# Patient Record
Sex: Male | Born: 1960 | Race: White | Hispanic: No | Marital: Single | Smoking: Current every day smoker
Health system: Southern US, Community
[De-identification: ages and names within clinical notes are randomized; demographics above are authoritative.]

## PROBLEM LIST (undated history)

## (undated) DIAGNOSIS — F17200 Nicotine dependence, unspecified, uncomplicated: Secondary | ICD-10-CM

## (undated) DIAGNOSIS — Z21 Asymptomatic human immunodeficiency virus [HIV] infection status: Secondary | ICD-10-CM

## (undated) DIAGNOSIS — B20 Human immunodeficiency virus [HIV] disease: Secondary | ICD-10-CM

## (undated) DIAGNOSIS — K219 Gastro-esophageal reflux disease without esophagitis: Secondary | ICD-10-CM

## (undated) DIAGNOSIS — G5622 Lesion of ulnar nerve, left upper limb: Secondary | ICD-10-CM

## (undated) HISTORY — DX: Lesion of ulnar nerve, left upper limb: G56.22

## (undated) HISTORY — DX: Nicotine dependence, unspecified, uncomplicated: F17.200

## (undated) HISTORY — DX: Gastro-esophageal reflux disease without esophagitis: K21.9

## (undated) HISTORY — DX: Asymptomatic human immunodeficiency virus (hiv) infection status: Z21

## (undated) HISTORY — DX: Human immunodeficiency virus (HIV) disease: B20

---

## 2001-12-07 ENCOUNTER — Ambulatory Visit (HOSPITAL_COMMUNITY): Admission: RE | Admit: 2001-12-07 | Discharge: 2001-12-07 | Payer: Self-pay | Admitting: Preventative Medicine

## 2001-12-07 ENCOUNTER — Encounter: Payer: Self-pay | Admitting: Preventative Medicine

## 2006-05-16 HISTORY — PX: COLONOSCOPY: SHX174

## 2006-08-25 ENCOUNTER — Ambulatory Visit: Payer: Self-pay | Admitting: Family Medicine

## 2006-08-31 ENCOUNTER — Ambulatory Visit (HOSPITAL_COMMUNITY): Admission: RE | Admit: 2006-08-31 | Discharge: 2006-08-31 | Payer: Self-pay | Admitting: Family Medicine

## 2006-09-12 ENCOUNTER — Ambulatory Visit: Payer: Self-pay | Admitting: Family Medicine

## 2006-10-23 ENCOUNTER — Ambulatory Visit: Payer: Self-pay | Admitting: Internal Medicine

## 2006-11-10 ENCOUNTER — Encounter: Payer: Self-pay | Admitting: Internal Medicine

## 2006-11-10 ENCOUNTER — Ambulatory Visit: Payer: Self-pay | Admitting: Internal Medicine

## 2006-11-10 ENCOUNTER — Ambulatory Visit (HOSPITAL_COMMUNITY): Admission: RE | Admit: 2006-11-10 | Discharge: 2006-11-10 | Payer: Self-pay | Admitting: Internal Medicine

## 2006-12-14 ENCOUNTER — Ambulatory Visit: Payer: Self-pay | Admitting: Family Medicine

## 2007-09-18 ENCOUNTER — Ambulatory Visit: Payer: Self-pay | Admitting: Family Medicine

## 2010-01-08 ENCOUNTER — Ambulatory Visit: Payer: Self-pay | Admitting: Family Medicine

## 2010-01-25 ENCOUNTER — Ambulatory Visit: Payer: Self-pay | Admitting: Family Medicine

## 2010-02-01 ENCOUNTER — Ambulatory Visit: Payer: Self-pay | Admitting: Family Medicine

## 2010-02-10 ENCOUNTER — Ambulatory Visit: Payer: Self-pay | Admitting: Family Medicine

## 2010-03-02 ENCOUNTER — Ambulatory Visit: Payer: Self-pay | Admitting: Family Medicine

## 2010-05-04 ENCOUNTER — Ambulatory Visit: Payer: Self-pay | Admitting: Family Medicine

## 2010-06-04 ENCOUNTER — Ambulatory Visit
Admission: RE | Admit: 2010-06-04 | Discharge: 2010-06-04 | Payer: Self-pay | Source: Home / Self Care | Attending: Family Medicine | Admitting: Family Medicine

## 2010-07-05 ENCOUNTER — Ambulatory Visit (INDEPENDENT_AMBULATORY_CARE_PROVIDER_SITE_OTHER): Payer: 59 | Admitting: Family Medicine

## 2010-07-05 DIAGNOSIS — Z79899 Other long term (current) drug therapy: Secondary | ICD-10-CM

## 2010-07-05 DIAGNOSIS — F411 Generalized anxiety disorder: Secondary | ICD-10-CM

## 2010-07-05 DIAGNOSIS — F329 Major depressive disorder, single episode, unspecified: Secondary | ICD-10-CM

## 2010-09-03 ENCOUNTER — Ambulatory Visit (INDEPENDENT_AMBULATORY_CARE_PROVIDER_SITE_OTHER): Payer: 59 | Admitting: Family Medicine

## 2010-09-03 DIAGNOSIS — F411 Generalized anxiety disorder: Secondary | ICD-10-CM

## 2010-09-03 DIAGNOSIS — F430 Acute stress reaction: Secondary | ICD-10-CM

## 2010-09-03 DIAGNOSIS — Z79899 Other long term (current) drug therapy: Secondary | ICD-10-CM

## 2010-09-03 DIAGNOSIS — B20 Human immunodeficiency virus [HIV] disease: Secondary | ICD-10-CM

## 2010-09-14 ENCOUNTER — Encounter: Payer: Self-pay | Admitting: Medical

## 2010-09-14 ENCOUNTER — Ambulatory Visit (INDEPENDENT_AMBULATORY_CARE_PROVIDER_SITE_OTHER): Payer: 59 | Admitting: Medical

## 2010-09-14 VITALS — BP 120/80 | HR 72 | Ht 72.0 in | Wt 141.0 lb

## 2010-09-14 DIAGNOSIS — L03312 Cellulitis of back [any part except buttock]: Secondary | ICD-10-CM

## 2010-09-14 DIAGNOSIS — L02219 Cutaneous abscess of trunk, unspecified: Secondary | ICD-10-CM

## 2010-09-14 MED ORDER — SULFAMETHOXAZOLE-TRIMETHOPRIM 800-160 MG PO TABS: 1.0000 | ORAL_TABLET | Freq: Two times a day (BID) | ORAL | Status: AC

## 2010-09-14 NOTE — Patient Instructions (Signed)
Warm compresses, begin antibiotic tonight, return for recheck in 3days.   Call/return sooner if worse.

## 2010-09-14 NOTE — Progress Notes (Signed)
  Subjective:    Patient ID: [name] [name], male    DOB: 21-Jun-1960, 50 y.o.   MRN: [id number]  HPI Comments: Here for insect bite on lower right back that he noticed when he got out of bed this morning.  Denies seeing an insect.  Denies prior hx/o abscesses or skin infections of this type.  No other c/o.  Otherwise feels fine.     Review of Systems  Constitutional: Negative for fever and chills.  Gastrointestinal: Negative for nausea and vomiting.  Genitourinary: Negative for flank pain.  Musculoskeletal: Positive for back pain. Negative for joint swelling and arthralgias.       Objective:   Physical Exam  Constitutional: He appears well-developed and well-nourished.  Musculoskeletal:       Lumbar back: He exhibits tenderness.       Back:  Skin: Rash noted.       Low right back with 8 cm area of erythema, induration, and tenderness.  No fluctuation or drainage.          Assessment & Plan:  Cellulitis, low back - warm compresses, Bactrim 160/800 mg BID x 10 days #20 no refill, recheck 3 days, call/return sooner if worse or not improving.

## 2010-09-15 ENCOUNTER — Encounter: Payer: Self-pay | Admitting: Medical

## 2010-09-17 ENCOUNTER — Ambulatory Visit: Payer: 59 | Admitting: Medical

## 2010-09-28 NOTE — Op Note (Signed)
NAMEDEAUNDRE, ALLSTON               ACCOUNT NO.:  [id number]   MEDICAL RECORD NO.:  [id number]          PATIENT TYPE:  AMB   LOCATION:  DAY                           FACILITY:  [facility]   PHYSICIAN:  R. Roetta Sessions, M.D. DATE OF BIRTH:  1961/01/24   DATE OF PROCEDURE:  11/10/2006  DATE OF DISCHARGE:                               OPERATIVE REPORT   INDICATIONS FOR PROCEDURE:  The patient is a 50 year old gentleman  referred courtesy of Sharlot Gowda, M.D. in [place] for evaluation  Hemoccult positive and found to be Hemoccult positive on recent digital  rectal examination as part of the routine physical. He has not had any  rectal bleeding or other GI symptoms, positive family history of colon  cancer.  Colonoscopy is now being done.  This approach has discussed the  patient at length.  Potential risks, benefits and was have been  reviewed, questions answered.  Please see documentation in the medical  record.   PROCEDURE NOTE:  O2 saturation, blood pressure, pulse on this patient  were monitored the entire procedure.  Conscious sedation Versed 7 mg IV,  Demerol 125 mg IV in divided doses.  Instrument Pentax system.   FINDINGS:  Digital rectal exam revealed no abnormalities.  The prep was  poor on the right side otherwise adequate.  Colon:  Colonic mucosa was  surveyed from the rectosigmoid junction through the left transverse  right colon to the appendiceal orifice, ileocecal valve and cecum.  These structures was seen photographed record.  As the hepatic flexure  was approached there was a thin coating of tenacious stool which was  very difficult to wash off. This extended down to the cecum down into  the cecum. Washing the mucosal surfaces best as could be done. I did see  of the verrucous or adenomatous appearing mucosa spanning fold just  adjacent to the appendiceal orifice.  Please see endoscopic photos.  This was subtle but appeared to be real. The prep again was poor.  I  elected cold biopsy this area of histologic study.  From this level  scope was slowly cautiously withdrawn.  All previous mucosal surfaces  were again seen.  Otherwise colonic mucosa appeared grossly normal but  poor prep compromising exam. The scope was pulled down the rectum.  Thorough examination of rectal mucosa including retroflex view of the  anal verge demonstrated only internal hemorrhoids.  The patient  tolerated the procedure well and was reactivated in endoscopy.   IMPRESSION:  1. Internal hemorrhoids, otherwise normal rectum.  2. Poor prep right colon, abnormality at the base of the cecum as      described above, biopsied.   RECOMMENDATIONS:  Follow-up on path. Further recommendations to follow.      Jonathon Bellows, M.D.  Electronically Signed     RMR/MEDQ  D:  11/10/2006  T:  11/10/2006  Job:  [id number]   cc:   Sharlot Gowda, M.D.  Fax: [phone]

## 2010-09-28 NOTE — H&P (Signed)
NAME:  [name] [name], [name] [name]                   ACCOUNT NO.:  0   MEDICAL RECORD NO.:  [id number]           PATIENT TYPE:   LOCATION:                                 FACILITY:   PHYSICIAN:  R. Roetta Sessions, M.D. DATE OF BIRTH:  12-09-1960   DATE OF ADMISSION:  DATE OF DISCHARGE:  LH                              HISTORY & PHYSICAL   REASON FOR CONSULTATION:  Hemoccult positive stools.   REFERRING PHYSICIAN:  Sharlot Gowda, M.D. in [place].   HISTORY OF PRESENT ILLNESS:  [name] [name] is a very pleasant, 50-  year-old, Caucasian male resident of [place] sent over courtesy of  Dr. Susann Givens in [place] for further evaluation of Hemoccult positive  stool and digital rectal exam recently which was obtained as part of a  routine physical.  [name] [name] has not had any lower GI tract symptoms  such hematochezia, melena, change in bowel habits or abdominal pain.  Family history is significant in that two second degree relatives  (paternal uncles) were diagnosed with colon cancer at relatively young  ages.  He has never had his lower GI tract evaluated.  He has a long  history of gastroesophageal reflux disease with occasional heartburn  symptoms which have been pretty well squelched with Prilosec 20 mg  orally daily.  He does not have any odynophagia, dysphagia, early  satiety, nausea, vomiting or weight loss.  He does occasionally smoke  and rarely consumes alcohol.   PAST MEDICAL HISTORY:  Gastroesophageal reflux disease.   PAST SURGICAL HISTORY:  No prior surgeries.   CURRENT MEDICATIONS:  Prilosec 20 mg OTC daily p.r.n.   ALLERGIES:  PENICILLIN.   FAMILY HISTORY:  As outlined above.  Mother has history of gallbladder  disease, status post cholecystectomy and history of ovarian problems.  Father succumbed to lung cancer at age 4.   SOCIAL HISTORY:  The patient is single.  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  He  rarely smokes cigarettes.  He consumes alcohol socially.  No illicit  drugs.   REVIEW OF SYSTEMS:  As a history of present illness.  No fever, chills.  No change in weight.  No yellow jaundice, clay-colored stools or dark-  colored urine.   PHYSICAL EXAMINATION:  GENERAL:  This is a bearded, 50 year old  gentleman resting comfortably.  VITAL SIGNS:  Weight 146, height 6 feet, temperature 98.6, BP 112/80,  pulse 72.  SKIN:  Warm and dry.  There is no jaundice noted.  No continued stigmata  of chronic liver disease.  No scleral icterus.  Conjunctivae are pink.  JVD is not prominent.  CHEST:  Lungs are clear to auscultation.  Regular rate and rhythm  without murmur, gallop or rub.  ABDOMEN:  Nondistended.  Positive bowel sounds, entirely soft, nontender  without appreciable mass or organomegaly.  EXTREMITIES:  No edema.  RECTAL:  Deferred to time of colonoscopy.   LABORATORY DATA AND X-RAY FINDINGS:  Labs done through Dr. Jola Babinski  office included CBC which was entirely normal except for slightly  elevated monocyte count.  His Chem 20 was  normal.  His lipid profile  revealed triglyceride level of 158, LDL 110.  Total cholesterol 198.   IMPRESSION:  [name] [name] is a very pleasant, 50 year old gentleman with  really no lower gastrointestinal tract symptoms who was found Hemoccult  positive recently as to family history in that two secondary relatives  have had colon cancer.   RECOMMENDATIONS:  I agree with Dr. Susann Givens that this gentleman needs to  have a colonoscopy for diagnostic purposes.  Potential risks, benefits  and alternatives have been reviewed.  Will plan to set up a colonoscopy  in the very near future at [name] [name] convenience.  Will make further  recommendations after colonoscopy has been performed.   I would like to than Dr. Susann Givens for his kind referral.      R. Roetta Sessions, M.D.  Electronically Signed     RMR/MEDQ  D:  10/23/2006  T:  10/24/2006  Job:  [id number]   cc:   Sharlot Gowda, M.D.  Fax: [phone]

## 2010-10-12 ENCOUNTER — Telehealth: Payer: Self-pay

## 2010-10-12 MED ORDER — CLONAZEPAM 0.5 MG PO TABS: 0.5000 mg | ORAL_TABLET | Freq: Two times a day (BID) | ORAL | Status: DC

## 2010-10-12 NOTE — Telephone Encounter (Signed)
Pt called and asked if you would refill his klonopin .5 to [facility]

## 2010-10-12 NOTE — Telephone Encounter (Signed)
Call in [facility] Klonopin. He will need a followup appointment in roughly one month.

## 2010-10-12 NOTE — Telephone Encounter (Signed)
Pt informed of needs of appt and meds called in

## 2010-11-16 ENCOUNTER — Telehealth: Payer: Self-pay | Admitting: Family Medicine

## 2010-11-16 NOTE — Telephone Encounter (Signed)
Please call therapist, Marvene Staff wants him to take Klonopin twice a day every day and he needs refill

## 2010-11-18 ENCOUNTER — Encounter: Payer: Self-pay | Admitting: Family Medicine

## 2010-11-18 ENCOUNTER — Ambulatory Visit (INDEPENDENT_AMBULATORY_CARE_PROVIDER_SITE_OTHER): Payer: 59 | Admitting: Family Medicine

## 2010-11-18 VITALS — BP 120/80 | HR 78 | Wt 144.0 lb

## 2010-11-18 DIAGNOSIS — F341 Dysthymic disorder: Secondary | ICD-10-CM

## 2010-11-18 DIAGNOSIS — F329 Major depressive disorder, single episode, unspecified: Secondary | ICD-10-CM | POA: Insufficient documentation

## 2010-11-18 DIAGNOSIS — F419 Anxiety disorder, unspecified: Secondary | ICD-10-CM

## 2010-11-18 DIAGNOSIS — F32A Depression, unspecified: Secondary | ICD-10-CM

## 2010-11-18 DIAGNOSIS — Z79899 Other long term (current) drug therapy: Secondary | ICD-10-CM

## 2010-11-18 DIAGNOSIS — Z21 Asymptomatic human immunodeficiency virus [HIV] infection status: Secondary | ICD-10-CM | POA: Insufficient documentation

## 2010-11-18 DIAGNOSIS — M25569 Pain in unspecified knee: Secondary | ICD-10-CM

## 2010-11-18 DIAGNOSIS — M25561 Pain in right knee: Secondary | ICD-10-CM

## 2010-11-18 MED ORDER — VENLAFAXINE HCL 75 MG PO TABS: 75.0000 mg | ORAL_TABLET | Freq: Three times a day (TID) | ORAL | Status: DC

## 2010-11-18 MED ORDER — SCOPOLAMINE 1 MG/3DAYS TD PT72: 1.0000 | MEDICATED_PATCH | TRANSDERMAL | Status: DC

## 2010-11-18 MED ORDER — CLONAZEPAM 0.5 MG PO TABS: 0.5000 mg | ORAL_TABLET | Freq: Two times a day (BID) | ORAL | Status: DC

## 2010-11-18 NOTE — Patient Instructions (Addendum)
We will send you to physical therapy to help with your knee. After the therapy if you continue to have difficulty, come back for reevaluation. Continue on your other medications and we will increase the Effexor to 3 times per day. Return here in one month for recheck. Continue on Klonopin. Use of Transderm for your fishing trip.

## 2010-11-18 NOTE — Progress Notes (Signed)
  Subjective:    Patient ID: [name] [name], male    DOB: July 11, 1960, 50 y.o.   MRN: [id number]  HPI He is here for recheck. He is doing better however still has anxiety induced tends to clench his teeth at night. He continues in counseling. He's had these symptoms for approximately the last 7 years. He continues on medications listed in the chart and is having no difficulty with this. He does complain of a remote history of right knee pain that he says gave way. This occurred again within the last month or 2. The previous episode was approximately 20 years ago.   Review of Systems     Objective:   Physical Exam Alert and in no distress. Exam of the knee shows positive anterior drawer. McMurray testing causes some discomfort. No swelling noted.       Assessment & Plan:  Anxiety depression. Right anterior cruciate ligament damage. HIV-positive. I will check CD4 and viral load. Also referred to physical therapy for rehabilitation. Explained that surgical intervention at this point would not be appropriate. He is to continue on his Klonopin and I will increase his Effexor. Recheck here in one month.

## 2010-11-19 ENCOUNTER — Telehealth: Payer: Self-pay

## 2010-11-19 LAB — T-HELPER CELLS (CD4) COUNT (NOT AT ARMC)
Absolute CD4: 149 /uL — ABNORMAL LOW (ref 381–1469)
CD4 T Helper %: 16 % — ABNORMAL LOW (ref 32–62)
WBC, lymph enumeration: 4.9 10*3/uL (ref 4.0–10.5)

## 2010-11-19 NOTE — Telephone Encounter (Signed)
PT IS AWARE OF RESULTS 

## 2010-11-24 ENCOUNTER — Telehealth: Payer: Self-pay

## 2010-11-24 NOTE — Telephone Encounter (Signed)
Called pt to let him know results of labs left message labs look good

## 2010-12-07 ENCOUNTER — Ambulatory Visit (HOSPITAL_COMMUNITY)
Admission: RE | Admit: 2010-12-07 | Discharge: 2010-12-07 | Disposition: A | Discharge status: Home / Self Care | Payer: 59 | Source: Ambulatory Visit | Attending: Family Medicine | Admitting: Family Medicine

## 2010-12-07 DIAGNOSIS — R262 Difficulty in walking, not elsewhere classified: Secondary | ICD-10-CM | POA: Insufficient documentation

## 2010-12-07 DIAGNOSIS — M6281 Muscle weakness (generalized): Secondary | ICD-10-CM | POA: Insufficient documentation

## 2010-12-07 DIAGNOSIS — IMO0002 Reserved for concepts with insufficient information to code with codable children: Secondary | ICD-10-CM | POA: Insufficient documentation

## 2010-12-07 DIAGNOSIS — Z9889 Other specified postprocedural states: Secondary | ICD-10-CM | POA: Insufficient documentation

## 2010-12-07 DIAGNOSIS — IMO0001 Reserved for inherently not codable concepts without codable children: Secondary | ICD-10-CM | POA: Insufficient documentation

## 2010-12-07 DIAGNOSIS — M25669 Stiffness of unspecified knee, not elsewhere classified: Secondary | ICD-10-CM | POA: Insufficient documentation

## 2010-12-07 DIAGNOSIS — M25569 Pain in unspecified knee: Secondary | ICD-10-CM | POA: Insufficient documentation

## 2010-12-07 NOTE — Patient Instructions (Addendum)
Educated pt on HEP and role of PT

## 2010-12-07 NOTE — Progress Notes (Signed)
Physical Therapy Evaluation  Patient Name: [name] [name]'W Date: 12/07/2010 Initial Eval: 12/07/10 Re-eval Due: 01/04/11 Visit # 1 of 8 Time: 0981-1914 Charges: 1 eval, 15 min TE HEP: Active HS stretch, ITB stretch, SLR w/foot ER, Functional squats, tandem stance, single leg stance.   Clinical Evaluation for Physical Therapy Services [facility]  Outpatient Programs [facility]  Patient: [name] [name] Initial Eval : 12/07/10  Physician: Susann Givens DOB: December 04, 2060  Age: 75 Diagnosis: R Knee Pain, sprain/strain  Onset Date: 1 month Dx Code: 719.46, 844.0  Employer:  [employer] Date of Surgery: N/A  Occupation: Night time supervisor Case Manager: N/A  Next MD visit: 1 month  /Claim #: N/A/   HISTORY: Pt is a 51  y.o male  referred to PT secondary to R ACL deficiency.  Pt reports that over 25 years ago that he remembers when he was standing his R knee went out from him for no apparent reason.  More recently about a month ago he was helping someone out to the car and felt his knee give way again, which then occurred again about 1 week ago. Denies other knee injuries.  Currently his c/co is L thigh pain from falling on a metal bar as well as instability to his R knee leading to falls.   RELEVANT MEDICAL HISTORY: Previous knee pain, HIV positive PREVIOUS THERAPY: Has not received therapy for this injury.  Cognitive Status: Pt is alert and oriented x4 Social History: 2 dogs and enjoys fishing, going in woods  Current Functional Status: More cautious with standing and walking and stairs with one handrail. Prior Functional Status: Walk: 8 hours, Stand: a few hours, Sit: unlimited.  Independent with reciprocal pattern.   Barriers to Education: None EMPLOYMENT DATA: walk up stairs, walk through narrow hallways, kneel on the ground, squat   SUBJECTIVE:  PATIENT GOAL: "I would like my knee not to give out again"   PAIN RATING: (on a scale from 0-10):  current: 0 /10  to.  Range: 10/10 when it 'pops out" Nature: Intermittent intense pain.  Aggravating: movement     Alleviating: decreasing activity OBJECTIVE: POSTURE: WNL OBSERVATION: Pt is notably cautious going from sit to stand to sit, with ambulating and with balance activities  MMT (RLE) AROM PROM  Hip Flexion 4/5    Gluteus Medius  4+/5    Gluteus Maximus 4/5    Adductor 3+/5    Knee Extension 4+/5 0   Knee Flexion 3+/5 135   Tibial Anterior  4/5           NEUROLOGIC: n/a GAIT: ambulates with mild increase knee flexion during stance phase secondary to increased fear of falling. FUNCTION: See above.  Balance: L foot posterior: 3 sec. R foot posterior: 4 sec.  L SLS: 4 sec. R SLS: 8 sec with increased knee flexion.   5 Sit to Stand w/o UE support: 14.6 sec w/increased caution PALPATION: Pain and tenderness with facial and tendonious adhesion to the biceps femoris and distal ITB insertion proximal to the fibular head.   Special Test:  + 90/90 HS test, + Ober's test, + Lachmans, + McMurrays, - Aplys distraction and compression    ASSEMENT: Pt is a 50  y.o male  referred to PT.  After examination it was found that the patient has current body structure impairments including increased pain, decreased functional strength, impaired balance, impaired flexibility, fascial adhesions and impaired perceived functional ability which are limiting his in the  ability to participate in community, work and social related activities.  Pt will benefit from skilled physical therapy service to address the above body structure impairments in order to maximize function in order to improve quality of life.      TREATMENT DIAGNOSIS: Knee Pain: 719.46, Sprain/Strain to R knee Rehab Potential: Good PLAN: 1. Therapeutic exercise to include stretching, strengthening and HEP. 2. Gait training 3. Balance re-education 4. Manual techniques and modalities as needed for pain reduction.  FREQUENCY / DURATION: 2  x/week x 4 weeks. SHORT TERM GOALS: to be met in 2 weeks. Patient will be able to: 1. Pt will be Independent in HEP in order to maximize therapeutic effect. 2. Pt will demonstrate RLE SLS x15 sec.  3. Pt will present with minimal adhesions to distal ITB/hamstring insertion.   4. Pt will improve power by demonstrating 5 sit to stands in 10 seconds.  LONG TERM GOALS: to be met in 4 weeks. Patient will be able to: 1. Pt will increase LE strength to [facility] to decrease risk of falls when walking on uneven surface.   2. Pt will report pain less than or equal to 1/10 for 75% of his day for improved quality of life. 3. Pt will improve her LEFS score by 9 points for improved perceived functional ability.  4. Pt will demonstrate tandem walking x100 ft on uneven surface for improved safety with outdoor activities.  5. Pt will demonstrate improved power by ascending and descending stairs independently.   INITIAL TREATMENT: Initial evaluation, therapeutic exercise and education in HEP. Pt agreeable to treatment plan.    PRECAUTIONS:N/A CONTRAINDICATIONS: N/A. Thank you for your referral,   ____________________________                            ______________________________   Annett Fabian, PT Date

## 2010-12-14 ENCOUNTER — Ambulatory Visit (HOSPITAL_COMMUNITY)
Admission: RE | Admit: 2010-12-14 | Discharge: 2010-12-14 | Disposition: A | Discharge status: Home / Self Care | Payer: 59 | Source: Ambulatory Visit | Attending: Family Medicine | Admitting: Family Medicine

## 2010-12-14 NOTE — Progress Notes (Signed)
Physical Therapy Treatment Patient Name: [name] [name] Date: 12/14/2010  Time In: 1:50 Time Out: 2:30 Visit #: 2 out of 8 Next Re-eval: 01/04/2011 Charge: therex 36 min  Subjective: Symptoms/Limitations Symptoms: Min pain semimembranosus insertion point maybe 1/10  Objective:    Exercise/Treatments STANDING: Heel/toe raises 10 reps Rockerboard 10 reps R/L, A/P Functional squats 10 reps single leg stance.  L 20 "; R 28" Cybex quad 3 pl Cybex hamst 3 pl SUPING: SAQ 10 reps Active HS stretch 3x 30" ITB stretch 3x 30" SLR w/foot ER 12 reps    Lumbar Stretches Active Hamstring Stretch: 30 seconds;3 reps ITB Stretch: 3 reps;30 seconds Stability Exercises Heel Raises: 10 reps (10x 2 sets) Lumbar Machine Exercises Cybex Knee Extension: 3PL 10x 2 Cybex Knee Flexion: 3PL 10x 2 Stationary Bike: 6' at 2.0 seat 11 Hip Stretches Active Hamstring Stretch: 30 seconds;3 reps Hip Exercises Straight Leg Raises: 10 reps (ER) Additional Hip Exercises SLS: L 20", R 28" max of 3 Rocker Board:  (10 reps A/P, R/L) Stationary Bike: 6' at 2.0 seat 11 Knee Stretches Active Hamstring Stretch: 30 seconds;3 reps ITB Stretch: 3 reps;30 seconds Knee Exercises Knee Extension: 10 reps;Supine (SAQ 10x 5" x 2 sets) Straight Leg Raises: 10 reps (ER) Heel Raises: 10 reps (10x 2 sets) Additional Knee Exercises Functional Squat: 10 reps (10 x2 ) Rocker Board:  (10 reps A/P, R/L) SLS: L 20", R 28" max of 3 Ankle Exercises Heel Raises: 10 reps (10x 2 sets) Toe Raise: 10 reps (10x 2 sets) Additional Ankle Exercises SLS: L 20", R 28" max of 3 Rocker Board:  (10 reps A/P, R/L) Balance Exercises Stationary Bike: 6' at 2.0 seat 11 Heel Raises: 10 reps (10x 2 sets) Toe Raise: 10 reps (10x 2 sets)    Goals   End of Session Patient Active Problem List  Diagnoses  . HIV positive  . Anxiety and depression  . Pain in joint of right knee  . Sprain and strain of unspecified site of  knee and leg   PT Assessment and Plan Clinical Impression Statement: Pt tolerated well with total tx, no increased pain at end of session.  Min vc required for proper tech with therex. PT Plan: continue with current POC, progress strength, flexibility and balance.  Juel Burrow 12/14/2010, 5:11 PM

## 2010-12-16 ENCOUNTER — Telehealth: Payer: Self-pay | Admitting: Family Medicine

## 2010-12-16 MED ORDER — CLONAZEPAM 0.5 MG PO TABS: 0.5000 mg | ORAL_TABLET | Freq: Two times a day (BID) | ORAL | Status: DC | PRN

## 2010-12-16 NOTE — Telephone Encounter (Signed)
His klonopin was called in to make sure he has an appointment

## 2010-12-17 ENCOUNTER — Ambulatory Visit (HOSPITAL_COMMUNITY)
Admission: RE | Admit: 2010-12-17 | Discharge: 2010-12-17 | Disposition: A | Discharge status: Home / Self Care | Payer: 59 | Source: Ambulatory Visit | Attending: Family Medicine | Admitting: Family Medicine

## 2010-12-17 DIAGNOSIS — M6281 Muscle weakness (generalized): Secondary | ICD-10-CM | POA: Insufficient documentation

## 2010-12-17 DIAGNOSIS — M25569 Pain in unspecified knee: Secondary | ICD-10-CM | POA: Insufficient documentation

## 2010-12-17 DIAGNOSIS — IMO0001 Reserved for inherently not codable concepts without codable children: Secondary | ICD-10-CM | POA: Insufficient documentation

## 2010-12-17 DIAGNOSIS — R262 Difficulty in walking, not elsewhere classified: Secondary | ICD-10-CM | POA: Insufficient documentation

## 2010-12-17 DIAGNOSIS — M25669 Stiffness of unspecified knee, not elsewhere classified: Secondary | ICD-10-CM | POA: Insufficient documentation

## 2010-12-17 NOTE — Progress Notes (Signed)
Physical Therapy Treatment Patient Name: [name] [name] ZOXWR'U Date: 12/17/2010  HPI: Pt with Hx of ACL deficit. Symptoms/Limitations Symptoms: Pt states pain has improved.  Precautions/Restrictions     Mobility (including Balance)       Exercise/Treatments Lumbar Stretches Active Hamstring Stretch: 3 reps;30 seconds Stability Exercises Heel Raises:  (right only x10) Lumbar Machine Exercises Cybex Knee Extension: 3PL 10X2 Cybex Knee Flexion: 4PL 10x2 Stationary Bike: 7'@3 .0 Hip Exercises Hamstring Curl:  (4# prone x 15) Hip Extension:  (prone 4# x 15) Bridges:  (with R slr L bridge with foot on wobble board then switch 10) Additional Hip Exercises Lateral Step Up:  (15 rep box away from//) Forward Step Up:  (15 rep with box away from //) Manufacturing systems engineer:  (r/l only ) Stationary Bike: 7'@3 .0 Knee Stretches Active Hamstring Stretch: 3 reps;30 seconds Knee Exercises Heel Raises:  (right only x10) Hip Extension:  (prone 4# x 15) Hamstring Curl:  (4# prone x 15) Hip ABduction: Strengthening;Right;15 reps (4#) Hip ADduction:  (4# 10 x sidelying) Bridges:  (with R slr L bridge with foot on wobble board then switch 10) Additional Knee Exercises Lateral Step Up:  (15 rep box away from//) Forward Step Up:  (15 rep with box away from //) Functional Squat:  (single leg squat x 15) Rocker Board:  (r/l only ) SLS with Vectors:  (3 rep with 10second holds wb on R) Rocker Board:  (r/l only ) Stationary Bike: 7'@3 .0  Goals   End of Session Patient Active Problem List  Diagnoses  . HIV positive  . Anxiety and depression  . Pain in joint of right knee  . Sprain and strain of unspecified site of knee and leg   PT - End of Session Activity Tolerance: Patient tolerated treatment well General Behavior During Session: [facility] for tasks performed Cognition: [facility] for tasks performed PT Assessment and Plan Clinical Impression Statement: Pt with LE weakness improving with  pain and strength. Rehab Potential: Good PT Frequency: Min 2X/week PT Duration: 4 weeks PT Plan: Continue to progress pt add lunges next rx.  Lonnie Reth,CINDY 12/17/2010, 2:37 PM

## 2010-12-20 ENCOUNTER — Ambulatory Visit (INDEPENDENT_AMBULATORY_CARE_PROVIDER_SITE_OTHER): Payer: 59 | Admitting: Family Medicine

## 2010-12-20 ENCOUNTER — Encounter: Payer: Self-pay | Admitting: Family Medicine

## 2010-12-20 ENCOUNTER — Other Ambulatory Visit: Payer: Self-pay

## 2010-12-20 VITALS — BP 114/80 | HR 97 | Wt 145.0 lb

## 2010-12-20 DIAGNOSIS — M25569 Pain in unspecified knee: Secondary | ICD-10-CM

## 2010-12-20 DIAGNOSIS — F329 Major depressive disorder, single episode, unspecified: Secondary | ICD-10-CM

## 2010-12-20 DIAGNOSIS — F341 Dysthymic disorder: Secondary | ICD-10-CM

## 2010-12-20 DIAGNOSIS — M25561 Pain in right knee: Secondary | ICD-10-CM

## 2010-12-20 NOTE — Patient Instructions (Signed)
Keep working on the rehabilitation and continue to see [employer]

## 2010-12-20 NOTE — Telephone Encounter (Signed)
Called med in again kolonipn .5 # 60 0 refills

## 2010-12-20 NOTE — Progress Notes (Signed)
  Subjective:    Patient ID: [name] [name], male    DOB: 10-05-1960, 50 y.o.   MRN: [id number]  HPI He is here for recheck. He continues in rehabilitation for his knee. He states that he is at least 50% better but does occasionally have difficulty if he turns the wrong way. He also continues in counseling with Darryl Hyers. I discussed his care with her today. He is making progress. He notes that he is leading less things bother him but still feels under tension and is drinking his teeth. He states the Klonopin does help with this.   Review of Systems     Objective:   Physical Exam Alert and in no distress. Right knee exam does show positive anterior drawer. Medial and lateral collateral ligaments intact.       Assessment & Plan:  Anterior cruciate ligament deficient right knee. Anxiety depression. Continue with the rehabilitation. Encouraged him to continue this for ever. He will continue in counseling with Darryl Hyers. Encouraged him to continue to work on limiting new ways to handle his stress. Did state that I expect that with time he will use less and less Klonopin.

## 2010-12-21 ENCOUNTER — Ambulatory Visit (HOSPITAL_COMMUNITY)
Admission: RE | Admit: 2010-12-21 | Discharge: 2010-12-21 | Disposition: A | Discharge status: Home / Self Care | Payer: 59 | Source: Ambulatory Visit | Attending: Family Medicine | Admitting: Family Medicine

## 2010-12-21 NOTE — Progress Notes (Signed)
Physical Therapy Treatment Patient Name: KAMERON BLETHEN ZOXWR'U Date: 12/21/2010  Time In: 1:55  Time Out: 2:32  Visit #: 4 out of 8  Next Re-eval: 01/04/2011  Charge: therex 36 min  SUBJECTIVE: Symptoms/Limitations Symptoms: Pt reports doing well today, not hurting. Pain Assessment Currently in Pain?: No/denies  Precautions/Restrictions :  None noted   Exercise/Treatments Warm-up:   Stationary Bike: 7'@3 .0  Standing:  Heelraises, R only 15X Lateral Step Up 4" step, 15X no UE's  Forward Step Up 6" step, 15X no UE's Forward step down 6" step, 15X no UE's Rockerboard R/L 2 minutes Functional Squats 15X SLS with Vectors 5X5" on R  Forward lunges 10X Sitting:  Cybex Knee Extension: 3PL 10X2  Cybex Knee Flexion: 4PL 10x2  Prone:  Hamstring Curl 4#  15X  Hip Extension 4#  15X  Supine: Bridges R only 10X Active Hamstring Stretch: 3 reps;30 seconds  Sidelying: Hip Abduction Right 15 reps 4#  Hip Adduction 4# 10 x     End of Session Patient Active Problem List  Diagnoses  . HIV positive  . Anxiety and depression  . Pain in joint of right knee  . Sprain and strain of unspecified site of knee and leg      Emeline Gins B 12/21/2010, 2:22 PM

## 2010-12-23 ENCOUNTER — Ambulatory Visit (HOSPITAL_COMMUNITY)
Admission: RE | Admit: 2010-12-23 | Discharge: 2010-12-23 | Disposition: A | Discharge status: Home / Self Care | Payer: 59 | Source: Ambulatory Visit | Attending: Physical Therapy | Admitting: Physical Therapy

## 2010-12-23 NOTE — Progress Notes (Signed)
Physical Therapy Treatment Patient Name: [name] [name] [name] Date: 12/23/2010 Time: 981-191 Charges: 21' TE Visit: 5 of 8 HPI: Symptoms/Limitations Symptoms: No pain reported.  Feels he is getting stronger and continuing with his HEP Pain Assessment Currently in Pain?: No/denies     Exercise/Treatments  Warm-up: Stationary Bike: 6'@3 .0  Standing:  Heelraises, R only 2X10  Lateral Step Up 6" step, 2x10 w/o UE  Forward Step Up 6" step, 2x10 no UE's  Forward step down 6" step, 2x10 no UE's  Wall Sits 10x10sec  H/S Curls 5# 3x10 SLS with Vectors 5X5" on R  Walking Forward lunges 1 RT Sitting:  Cybex Knee Extension: 4PL 10X2   Cybex Knee Flexion: 4PL 10x2     Goals  Progressing towards End of Session Patient Active Problem List  Diagnoses  . HIV positive  . Anxiety and depression  . Pain in joint of right knee  . Sprain and strain of unspecified site of knee and leg   PT - End of Session Activity Tolerance: Patient tolerated treatment well PT Assessment and Plan Clinical Impression Statement: Pt able to complete all ther-ex today without an increase in pain to his R knee joint.  Notable muscular fatigue with ther-ex and continues to need improvement with balance.  PT Plan: Cont to progress strength  [name] [name] 12/23/2010, 4:43 PM

## 2010-12-24 ENCOUNTER — Ambulatory Visit (HOSPITAL_COMMUNITY): Payer: 59

## 2010-12-27 ENCOUNTER — Ambulatory Visit (HOSPITAL_COMMUNITY)
Admission: RE | Admit: 2010-12-27 | Discharge: 2010-12-27 | Disposition: A | Discharge status: Home / Self Care | Payer: 59 | Source: Ambulatory Visit | Attending: Physical Therapy | Admitting: Physical Therapy

## 2010-12-27 NOTE — Progress Notes (Signed)
Physical Therapy Treatment Patient Name: [name] [name] Date: 12/27/2010  Time: 1:04pm - 1: 43pm Charges: 41' TE  Visit: 6 of 8   SUBJECTIVE: Symptoms/Limitations Symptoms: No pain today. Pain Assessment Currently in Pain?: No/denies  Precautions/Restrictions:  None noted         Exercise/Treatments Warm-up: Stationary Bike: 6'@3 .0  Standing:  Heelraises, R only 20 Lateral Step Up 6" step, 20  no UE  Forward Step Up 6" step, 20 no UE's  Forward step down 6" step, 20 no UE's  Wall Sits 10x10sec  H/S Curls 5# 20  SLS with Vectors 10X5" on R  Walking Forward lunges 2 RT  Sitting:  Cybex Knee Extension: 4PL 10X2  Cybex Knee Flexion: 4PL 10x2       End of Session Patient Active Problem List  Diagnoses  . HIV positive  . Anxiety and depression  . Pain in joint of right knee  . Sprain and strain of unspecified site of knee and leg   PT - End of Session Activity Tolerance: Patient tolerated treatment well General Behavior During Session: [facility] for tasks performed Cognition: [facility] for tasks performed PT Assessment and Plan Clinical Impression Statement: Pt. able to perform therex with VC's for form and posture; eccentric control cues needed with steps and cybex machines. PT Treatment/Interventions: Therapeutic exercise PT Plan: Cont to progress towards goals; work on reciprocal stairs.  Bascom Levels, Evyn Putzier B 12/27/2010, 1:53 PM

## 2011-01-03 ENCOUNTER — Ambulatory Visit (HOSPITAL_COMMUNITY): Payer: 59 | Admitting: Physical Therapy

## 2011-01-05 ENCOUNTER — Ambulatory Visit (HOSPITAL_COMMUNITY)
Admission: RE | Admit: 2011-01-05 | Discharge: 2011-01-05 | Disposition: A | Discharge status: Home / Self Care | Payer: 59 | Source: Ambulatory Visit | Attending: Family Medicine | Admitting: Family Medicine

## 2011-01-05 NOTE — Progress Notes (Signed)
Physical Therapy Treatment Patient Details  Name: SAMPSON SELF MRN: [id number] Date of Birth: 05/05/1961  Today's Date: 01/05/2011 Time: 0865-7846 Time Calculation (min): 38 min Charges: 3 TE Visit#: 7 of 8 Re-eval: 01/18/11  Subjective: Symptoms/Limitations Symptoms: Pt reports he is feeling good and has not had pain for the last few weeks.  He denies pain when walking up and down stairs, going from sit to stand.  Able to go walk and stand without any caution and reports he sometimes even forgets about his R knee.   Pain Assessment Currently in Pain?: No/denies  Exercise/Treatments Aerobic: Bike x 6 min 3.0 Machines for Strengthening   Cybex Knee extension 5.5 PL 20x  Cybex Knee flexion 6.5 Pl 20x Standing   Rocker Board A/P and R/L 1 min each  Tandem Walking 3 RT  Gastroc Stretch 3x30sec  Soleus Stretch 3x30 sec  Lateral Step Up 6" step, 20 no UE   Forward Step Up 6" step, 20 no UE's   Forward step down 6" step, 20 no UE's   Wall Sits 10x10sec   SLS with Vectors 10X5" on R   Walking Forward lunges 2 RT   Stairwell 1 RT  Physical Therapy Assessment and Plan PT Assessment and Plan Clinical Impression Statement: Pt continues to make improvement with his strength and balance.  Pt continues to have limited pain with activities that resolves after moments.   PT Plan: Re-eval 2 visits.  Cont to progress, add tandem walking on balance beam.     Goals PT Short Term Goals PT Short Term Goal 1: Pt will be Independent in HEP in order to maximize therapeutic effect. PT Short Term Goal 1 - Progress: Met PT Short Term Goal 2: Pt will demonstrate RLE SLS x15 sec.  PT Short Term Goal 2 - Progress: Progressing toward goal PT Short Term Goal 3: Pt will present with minimal adhesions to distal ITB/hamstring insertion.   PT Short Term Goal 3 - Progress: Progressing toward goal PT Short Term Goal 4: Pt will improve power by demonstrating 5 sit to stands in 10 seconds.  PT Long Term  Goals PT Long Term Goal 1 - Progress: Progressing toward goal PT Long Term Goal 2: Pt will report pain less than or equal to 1/10 for 75% of his day for improved quality of life. PT Long Term Goal 2 - Progress: Met Long Term Goal 3: Pt will improve her LEFS score by 9 points for improved perceived functional ability.  Long Term Goal 3 Progress: Progressing toward goal Long Term Goal 4: Pt will demonstrate tandem walking x100 ft on uneven surface for improved safety with outdoor activities.   Long Term Goal 4 Progress: Progressing toward goal  Problem List Patient Active Problem List  Diagnoses  . HIV positive  . Anxiety and depression  . Pain in joint of right knee  . Sprain and strain of unspecified site of knee and leg    PT - End of Session Activity Tolerance: Patient tolerated treatment well  Knox Holdman 01/05/2011, 1:46 PM

## 2011-01-10 ENCOUNTER — Inpatient Hospital Stay (HOSPITAL_COMMUNITY): Admission: RE | Admit: 2011-01-10 | Payer: 59 | Source: Ambulatory Visit | Admitting: Physical Therapy

## 2011-01-18 ENCOUNTER — Telehealth: Payer: Self-pay | Admitting: Family Medicine

## 2011-01-18 ENCOUNTER — Ambulatory Visit (HOSPITAL_COMMUNITY)
Admission: RE | Admit: 2011-01-18 | Discharge: 2011-01-18 | Disposition: A | Discharge status: Home / Self Care | Payer: 59 | Source: Ambulatory Visit | Attending: Family Medicine | Admitting: Family Medicine

## 2011-01-18 DIAGNOSIS — M6281 Muscle weakness (generalized): Secondary | ICD-10-CM | POA: Insufficient documentation

## 2011-01-18 DIAGNOSIS — IMO0001 Reserved for inherently not codable concepts without codable children: Secondary | ICD-10-CM | POA: Insufficient documentation

## 2011-01-18 DIAGNOSIS — M25669 Stiffness of unspecified knee, not elsewhere classified: Secondary | ICD-10-CM | POA: Insufficient documentation

## 2011-01-18 DIAGNOSIS — M25569 Pain in unspecified knee: Secondary | ICD-10-CM | POA: Insufficient documentation

## 2011-01-18 DIAGNOSIS — R262 Difficulty in walking, not elsewhere classified: Secondary | ICD-10-CM | POA: Insufficient documentation

## 2011-01-18 MED ORDER — CLONAZEPAM 0.5 MG PO TABS: 0.5000 mg | ORAL_TABLET | Freq: Two times a day (BID) | ORAL | Status: DC | PRN

## 2011-01-18 NOTE — Telephone Encounter (Signed)
Klonopin called in. His dosing regimen has been relatively stable so I will be watching this

## 2011-01-18 NOTE — Progress Notes (Signed)
Physical Therapy Treatment/DC Summary Patient Details  Name: [name] [name] MRN: [id number] Date of Birth: May 18, 1960  Today's Date: 01/18/2011 Time: 4098-1191 Time Calculation (min): 40 min Charges: 1 MMT, 25 TE Visit#: 8 of 8 Re-eval: 01/18/11  Subjective: Symptoms/Limitations Symptoms: I am doing pretty well today.  No pain in the past few weeks.    Exercise/Treatments Aerobic: Elliptical x 5 min 3.0  Supine:  Active HS stretch 3x30 sec  ITB stretch 3x30 sec Standing   Tandem Walking on grass x151ft   Gastroc Stretch 3x30sec   Soleus Stretch 3x30 sec   Wall Sits 10x10sec   SLS with Vectors 10X5" on R   Stairwell 3 RT w/reciprocal pattern  Physical Therapy Assessment and Plan      Goals PT Short Term Goals PT Short Term Goal 1: Pt will be Independent in HEP in order to maximize therapeutic effect. PT Short Term Goal 1 - Progress: Met PT Short Term Goal 2: Pt will demonstrate RLE SLS x15 sec.  PT Short Term Goal 2 - Progress: Met PT Short Term Goal 3: Pt will present with minimal adhesions to distal ITB/hamstring insertion.   PT Short Term Goal 3 - Progress: Met PT Short Term Goal 4: Pt will improve power by demonstrating 5 sit to stands in 10 seconds.  PT Short Term Goal 4 - Progress: Met PT Long Term Goals PT Long Term Goal 1: Pt will increase LE strength to [facility] to decrease risk of falls when walking on uneven surface PT Long Term Goal 1 - Progress: Met PT Long Term Goal 2: Pt will report pain less than or equal to 1/10 for 75% of his day for improved quality of life. PT Long Term Goal 2 - Progress: Met Long Term Goal 3: Pt will improve her LEFS score by 9 points for improved perceived functional ability.  Long Term Goal 4: Pt will demonstrate tandem walking x100 ft on uneven surface for improved safety with outdoor activities.   Long Term Goal 4 Progress: Met  Problem List Patient Active Problem List  Diagnoses  . HIV positive  . Anxiety and depression  .  Pain in joint of right knee  . Sprain and strain of unspecified site of knee and leg       Kyleeann Cremeans 01/18/2011, 1:45 PM January 18, 2011

## 2011-02-15 ENCOUNTER — Other Ambulatory Visit: Payer: Self-pay

## 2011-02-15 ENCOUNTER — Telehealth: Payer: Self-pay | Admitting: Family Medicine

## 2011-02-15 MED ORDER — AZITHROMYCIN 600 MG PO TABS: 1200.0000 mg | ORAL_TABLET | ORAL | Status: DC

## 2011-02-15 MED ORDER — CLONAZEPAM 0.5 MG PO TABS: 0.5000 mg | ORAL_TABLET | Freq: Two times a day (BID) | ORAL | Status: DC | PRN

## 2011-02-15 NOTE — Telephone Encounter (Signed)
Med was called in

## 2011-02-15 NOTE — Telephone Encounter (Signed)
Dr.Lalonde is this ok

## 2011-02-15 NOTE — Telephone Encounter (Signed)
Renew the meds that time he needs to set up an appointment with me within the next month

## 2011-02-15 NOTE — Telephone Encounter (Signed)
[name] [name] CALLED AND ASK TO REFILL HIS KLONOPIN .5 IS THIS OK IF SO HOW MANY

## 2011-02-15 NOTE — Telephone Encounter (Signed)
Called in med per jcl 

## 2011-02-18 ENCOUNTER — Other Ambulatory Visit: Payer: Self-pay | Admitting: Family Medicine

## 2011-02-21 ENCOUNTER — Telehealth: Payer: Self-pay | Admitting: Family Medicine

## 2011-02-21 MED ORDER — EFAVIRENZ-EMTRICITAB-TENOFOVIR 600-200-300 MG PO TABS: 1.0000 | ORAL_TABLET | Freq: Every day | ORAL | Status: DC

## 2011-02-21 NOTE — Telephone Encounter (Signed)
Atripla was reviewed

## 2011-03-14 ENCOUNTER — Other Ambulatory Visit: Payer: Self-pay | Admitting: Family Medicine

## 2011-03-14 ENCOUNTER — Telehealth: Payer: Self-pay | Admitting: Family Medicine

## 2011-03-14 MED ORDER — VENLAFAXINE HCL 75 MG PO TABS: 75.0000 mg | ORAL_TABLET | Freq: Three times a day (TID) | ORAL | Status: DC

## 2011-03-14 NOTE — Telephone Encounter (Signed)
[facility]oned [facility] and refilled Effexor 75 mg with one refill

## 2011-03-14 NOTE — Telephone Encounter (Signed)
Let him know that I renew the Effexor however not to Klonopin. He needs an appointment

## 2011-03-14 NOTE — Telephone Encounter (Signed)
Effexor was renewed however Klonopin was not. He needs an appointment.

## 2011-03-14 NOTE — Telephone Encounter (Signed)
PATIENT WAS NOTIFIED. CS

## 2011-03-22 ENCOUNTER — Telehealth: Payer: Self-pay | Admitting: Family Medicine

## 2011-03-24 ENCOUNTER — Encounter: Payer: Self-pay | Admitting: Family Medicine

## 2011-03-24 ENCOUNTER — Other Ambulatory Visit: Payer: Self-pay | Admitting: Internal Medicine

## 2011-03-24 ENCOUNTER — Ambulatory Visit (INDEPENDENT_AMBULATORY_CARE_PROVIDER_SITE_OTHER): Payer: 59 | Admitting: Family Medicine

## 2011-03-24 VITALS — BP 110/80 | HR 78 | Wt 145.0 lb

## 2011-03-24 DIAGNOSIS — F419 Anxiety disorder, unspecified: Secondary | ICD-10-CM

## 2011-03-24 DIAGNOSIS — F32A Depression, unspecified: Secondary | ICD-10-CM

## 2011-03-24 DIAGNOSIS — F341 Dysthymic disorder: Secondary | ICD-10-CM

## 2011-03-24 DIAGNOSIS — B20 Human immunodeficiency virus [HIV] disease: Secondary | ICD-10-CM

## 2011-03-24 MED ORDER — EFAVIRENZ-EMTRICITAB-TENOFOVIR 600-200-300 MG PO TABS: 1.0000 | ORAL_TABLET | Freq: Every day | ORAL | Status: DC

## 2011-03-24 MED ORDER — VENLAFAXINE HCL ER 150 MG PO CP24: 150.0000 mg | ORAL_CAPSULE | Freq: Two times a day (BID) | ORAL | Status: DC

## 2011-03-24 MED ORDER — CLONAZEPAM 0.5 MG PO TABS: 0.5000 mg | ORAL_TABLET | Freq: Two times a day (BID) | ORAL | Status: DC | PRN

## 2011-03-24 NOTE — Telephone Encounter (Signed)
Pt has appt 03/24/2011

## 2011-03-24 NOTE — Patient Instructions (Signed)
Take two Effexor twice a day of the pills you have right now been switch over to the new one which will be one pill twice a day

## 2011-03-24 NOTE — Telephone Encounter (Signed)
Called in clonazepam.

## 2011-03-24 NOTE — Progress Notes (Signed)
  Subjective:    Patient ID: [name] [name], male    DOB: 11/06/1960, 50 y.o.   MRN: [id number]  HPI He is here for an interval evaluation. He continues on his HIV medications and is having no difficulty with them. He was last evaluated in May. He is having no fever, chills, cough, congestion, weight change. He continues on his Effexor using 150 in the morning and 75mg  at night. He states that this does help keep him more stable and willing to go out in the public. He continues on Klonopin using it twice a day stating that it helps with his racing thoughts and with sleep. Winquist further about how long he's had these racing thoughts, this is been within the last 5 years and not his entire life.   Review of Systems     Objective:   Physical Exam Alert and in no distress otherwise not examined       Assessment & Plan:   1. Human immunodeficiency virus (HIV) disease  T-helper cells (CD4) count, HIV 1 RNA quant-no reflex-bld  2. Anxiety and depression     I will do followup on his HIV status with appropriate blood work. Increase his Effexor to 150 mg twice a day. He is to return here in one month or call me if he has difficulty prior to this.

## 2011-03-25 LAB — T-HELPER CELLS (CD4) COUNT (NOT AT ARMC)
Absolute CD4: 148 /uL — ABNORMAL LOW (ref 381–1469)
CD4 T Helper %: 16 % — ABNORMAL LOW (ref 32–62)

## 2011-03-28 LAB — HIV-1 RNA QUANT-NO REFLEX-BLD: HIV 1 RNA Quant: NOT DETECTED copies/mL (ref <20)

## 2011-04-26 ENCOUNTER — Encounter: Payer: Self-pay | Admitting: Family Medicine

## 2011-04-26 ENCOUNTER — Ambulatory Visit (INDEPENDENT_AMBULATORY_CARE_PROVIDER_SITE_OTHER): Payer: 59 | Admitting: Family Medicine

## 2011-04-26 VITALS — BP 142/90 | HR 92 | Wt 147.0 lb

## 2011-04-26 DIAGNOSIS — F419 Anxiety disorder, unspecified: Secondary | ICD-10-CM

## 2011-04-26 DIAGNOSIS — L089 Local infection of the skin and subcutaneous tissue, unspecified: Secondary | ICD-10-CM

## 2011-04-26 DIAGNOSIS — F341 Dysthymic disorder: Secondary | ICD-10-CM

## 2011-04-26 DIAGNOSIS — L723 Sebaceous cyst: Secondary | ICD-10-CM

## 2011-04-26 MED ORDER — CLONAZEPAM 0.5 MG PO TABS: 0.5000 mg | ORAL_TABLET | Freq: Two times a day (BID) | ORAL | Status: DC | PRN

## 2011-04-26 MED ORDER — VENLAFAXINE HCL 75 MG PO TABS: 75.0000 mg | ORAL_TABLET | Freq: Three times a day (TID) | ORAL | Status: DC

## 2011-04-26 MED ORDER — BUSPIRONE HCL 7.5 MG PO TABS: 7.5000 mg | ORAL_TABLET | Freq: Two times a day (BID) | ORAL | Status: DC

## 2011-04-26 NOTE — Progress Notes (Signed)
  Subjective:    Patient ID: [name] [name], male    DOB: [birth year], 50 y.o.   MRN: [id number]  HPI Is here for consultation. He has increase his Effexor to 150 twice a day but not noticed any improvement in his symptoms. He tried stopping the Klonopin however the anxiety returned. He also has a lesion on his back has been bothering him for the last several days and is painful.  Review of Systems     Objective:   Physical Exam Alert and in no distress with appropriate affect. A 3 cm erythematous tender raised lesion is noted over the mid back area.       Assessment & Plan:   1. Anxiety and depression   2. Infected sebaceous cyst of skin    continue Effexor 75 twice a day and use of Klonopin. I will also place him on BuSpar. He was started 7-1/2 twice a day and call me in 2 weeks. The lesion was injected with Xylocaine. An I+ D. was performed. The cyst was load without difficulty. The wound was packed. He will return here in 2 days.

## 2011-04-26 NOTE — Patient Instructions (Signed)
Start BuSpar twice per day and call me in 2 weeks. I will also switch her back to Effexor 75 mg twice per day.

## 2011-04-27 ENCOUNTER — Telehealth: Payer: Self-pay | Admitting: Internal Medicine

## 2011-04-27 NOTE — Telephone Encounter (Signed)
Renew both of these medications. Give 60 pills the Effexor is 75mg   twice a day.

## 2011-04-27 NOTE — Telephone Encounter (Signed)
Med called in

## 2011-04-28 ENCOUNTER — Encounter: Payer: Self-pay | Admitting: Family Medicine

## 2011-04-28 ENCOUNTER — Ambulatory Visit (INDEPENDENT_AMBULATORY_CARE_PROVIDER_SITE_OTHER): Payer: 59 | Admitting: Family Medicine

## 2011-04-28 VITALS — BP 140/90 | HR 92 | Wt 147.0 lb

## 2011-04-28 DIAGNOSIS — L723 Sebaceous cyst: Secondary | ICD-10-CM

## 2011-04-28 NOTE — Progress Notes (Signed)
  Subjective:    Patient ID: [name] [name], male    DOB: 1961/01/14, 50 y.o.   MRN: [id number]  HPI He is here for recheck. The lesion is healing quite nicely.   Review of Systems     Objective:   Physical Exam Exam of his back shows lesion to be draining minimally. Packing was removed.       Assessment & Plan:  Sebaceous cyst abscess return here as needed.

## 2011-05-11 ENCOUNTER — Telehealth: Payer: Self-pay | Admitting: Family Medicine

## 2011-05-11 MED ORDER — BUSPIRONE HCL 15 MG PO TABS: 15.0000 mg | ORAL_TABLET | Freq: Three times a day (TID) | ORAL | Status: DC

## 2011-05-11 NOTE — Telephone Encounter (Signed)
N the cold and the higher strength of the BuSpar and he can start today

## 2011-05-11 NOTE — Telephone Encounter (Signed)
BuSpar increased to 15 twice a day

## 2011-05-12 NOTE — Telephone Encounter (Signed)
Called and notified pt of the increase in med

## 2011-05-26 ENCOUNTER — Telehealth: Payer: Self-pay

## 2011-05-26 NOTE — Telephone Encounter (Signed)
Renew Klonopin and have him followup with me with an appointment in about a month

## 2011-05-26 NOTE — Telephone Encounter (Signed)
Pt called and request refill of klonopin and to let you know that he has not seen really any change with Buspar but will continue taking also wants me to call him back if you ok refill

## 2011-05-27 ENCOUNTER — Other Ambulatory Visit: Payer: Self-pay

## 2011-05-27 MED ORDER — CLONAZEPAM 0.5 MG PO TABS: 0.5000 mg | ORAL_TABLET | Freq: Two times a day (BID) | ORAL | Status: DC | PRN

## 2011-05-27 NOTE — Telephone Encounter (Signed)
Called klonopin in per [employer]

## 2011-06-09 ENCOUNTER — Other Ambulatory Visit: Payer: Self-pay | Admitting: Family Medicine

## 2011-06-09 ENCOUNTER — Telehealth: Payer: Self-pay | Admitting: Family Medicine

## 2011-06-09 MED ORDER — BUSPIRONE HCL 15 MG PO TABS: 15.0000 mg | ORAL_TABLET | Freq: Two times a day (BID) | ORAL | Status: DC

## 2011-06-09 NOTE — Telephone Encounter (Signed)
Pt called he is out of his Buspar.  He was taking the 15mg  tid should be bid.  Called [facility] and corrected dosage.

## 2011-06-09 NOTE — Telephone Encounter (Signed)
Check with Lafonda Mosses and make sure that he is getting BID dosing. Make sure that he has an appt within the next month or two

## 2011-06-10 NOTE — Telephone Encounter (Signed)
It looks like Lafonda Mosses has done this

## 2011-06-21 ENCOUNTER — Encounter: Payer: Self-pay | Admitting: Internal Medicine

## 2011-06-28 ENCOUNTER — Ambulatory Visit (INDEPENDENT_AMBULATORY_CARE_PROVIDER_SITE_OTHER): Payer: 59 | Admitting: Family Medicine

## 2011-06-28 ENCOUNTER — Encounter: Payer: Self-pay | Admitting: Family Medicine

## 2011-06-28 VITALS — BP 120/80 | HR 103 | Ht 72.0 in | Wt 147.0 lb

## 2011-06-28 DIAGNOSIS — M25569 Pain in unspecified knee: Secondary | ICD-10-CM

## 2011-06-28 DIAGNOSIS — F411 Generalized anxiety disorder: Secondary | ICD-10-CM

## 2011-06-28 DIAGNOSIS — F419 Anxiety disorder, unspecified: Secondary | ICD-10-CM

## 2011-06-28 DIAGNOSIS — M25561 Pain in right knee: Secondary | ICD-10-CM

## 2011-06-28 NOTE — Progress Notes (Signed)
  Subjective:    Patient ID: [name] [name], male    DOB: 10/19/1960, 51 y.o.   MRN: [id number]  HPI He is here for consultation. He continues on his anxiety medications. He states that he did not think the BuSpar is giving him much help. He does state that he is doing better going into crowded places since being placed on the Effexor. He still does use her Klonopin roughly twice per day to help with anxiety. He cites work and other stresses as causing difficulty. He also would like his right knee evaluated. He states he injured just proximally 25 years ago and has had intermittent difficulty with this. I did examine him and August. That note was reviewed. He continues to complain of knee instability and states that the rehabilitation did not really help.   Review of Systems     Objective:   Physical Exam Alert and in no distress. Right knee exam shows an effusion. Anterior drawer is positive. McMurray testing negative. Medial and lateral collateral ligaments intact      Assessment & Plan:   1. Anxiety disorder    2. Right knee pain  MR Knee Right Wo Contrast   had a long discussion with him concerning the use of his Klonopin for dealing with the stress and anxiety of various issues in his life. Strongly encouraged him to work on changing his approach and not let these things controlled his life and the need for medications. Recommended that he stretch the use of Klonopin to at least 45 days. He will continue to work with his therapist. MRI ordered for possible anterior cruciate ligament damage

## 2011-06-28 NOTE — Patient Instructions (Addendum)
Keep working with her therapist to get your anxiety and stress under better control. Just remember that no one can stress you unless you've first give them permission. Work towards making your Klonopin last year at least the next 45 days.

## 2011-06-30 ENCOUNTER — Other Ambulatory Visit: Payer: Self-pay

## 2011-06-30 ENCOUNTER — Ambulatory Visit (HOSPITAL_COMMUNITY)
Admission: RE | Admit: 2011-06-30 | Discharge: 2011-06-30 | Disposition: A | Discharge status: Home / Self Care | Payer: 59 | Source: Ambulatory Visit | Attending: Family Medicine | Admitting: Family Medicine

## 2011-06-30 DIAGNOSIS — M25569 Pain in unspecified knee: Secondary | ICD-10-CM | POA: Insufficient documentation

## 2011-06-30 DIAGNOSIS — X58XXXA Exposure to other specified factors, initial encounter: Secondary | ICD-10-CM | POA: Insufficient documentation

## 2011-06-30 DIAGNOSIS — S83509A Sprain of unspecified cruciate ligament of unspecified knee, initial encounter: Secondary | ICD-10-CM | POA: Insufficient documentation

## 2011-06-30 DIAGNOSIS — M25561 Pain in right knee: Secondary | ICD-10-CM

## 2011-06-30 DIAGNOSIS — M25469 Effusion, unspecified knee: Secondary | ICD-10-CM | POA: Insufficient documentation

## 2011-06-30 MED ORDER — VENLAFAXINE HCL 75 MG PO TABS: 75.0000 mg | ORAL_TABLET | Freq: Two times a day (BID) | ORAL | Status: DC

## 2011-06-30 NOTE — Telephone Encounter (Signed)
Med sent in per pt called and said had no more refills and would like 90 days at 2 a day

## 2011-08-04 ENCOUNTER — Telehealth: Payer: Self-pay | Admitting: Internal Medicine

## 2011-08-04 MED ORDER — CLONAZEPAM 0.5 MG PO TABS: 0.5000 mg | ORAL_TABLET | Freq: Two times a day (BID) | ORAL | Status: DC | PRN

## 2011-08-04 NOTE — Telephone Encounter (Signed)
Called med in per jcl 

## 2011-08-04 NOTE — Telephone Encounter (Signed)
Tell him that he has not spread this out to every 45 days as we talked about on his last visit. I will renew it for one month and then see how he is doing

## 2011-08-29 ENCOUNTER — Telehealth: Payer: Self-pay | Admitting: Internal Medicine

## 2011-08-29 MED ORDER — BUSPIRONE HCL 15 MG PO TABS: 15.0000 mg | ORAL_TABLET | Freq: Two times a day (BID) | ORAL | Status: DC

## 2011-08-29 NOTE — Telephone Encounter (Signed)
Buspar was done on 1/24 #60 with 5 refills. i redid it for #180 with 1 refill

## 2011-08-31 ENCOUNTER — Encounter (HOSPITAL_BASED_OUTPATIENT_CLINIC_OR_DEPARTMENT_OTHER): Payer: Self-pay | Admitting: *Deleted

## 2011-08-31 NOTE — Progress Notes (Signed)
No labs needed

## 2011-09-01 ENCOUNTER — Telehealth: Payer: Self-pay

## 2011-09-01 NOTE — Telephone Encounter (Signed)
Talked with pt told him word for what was written

## 2011-09-01 NOTE — Telephone Encounter (Signed)
Tell him that he will be given pain medication when he has the repair and I would like to hold off on the anti-anxiety medicine since they both have an effect on his brain.

## 2011-09-01 NOTE — Telephone Encounter (Signed)
Pt called and asked if we could refill his klonopin said he has about 10 or 12 left but is having ACL repair this week and wanted to make sure he had enough

## 2011-09-02 ENCOUNTER — Ambulatory Visit (HOSPITAL_BASED_OUTPATIENT_CLINIC_OR_DEPARTMENT_OTHER): Payer: 59 | Admitting: Anesthesiology

## 2011-09-02 ENCOUNTER — Encounter (HOSPITAL_BASED_OUTPATIENT_CLINIC_OR_DEPARTMENT_OTHER)
Admission: RE | Disposition: A | Discharge status: Home / Self Care | Payer: Self-pay | Source: Ambulatory Visit | Attending: Orthopedic Surgery

## 2011-09-02 ENCOUNTER — Ambulatory Visit (HOSPITAL_BASED_OUTPATIENT_CLINIC_OR_DEPARTMENT_OTHER)
Admission: RE | Admit: 2011-09-02 | Discharge: 2011-09-02 | Disposition: A | Discharge status: Home / Self Care | Payer: 59 | Source: Ambulatory Visit | Attending: Orthopedic Surgery | Admitting: Orthopedic Surgery

## 2011-09-02 ENCOUNTER — Encounter (HOSPITAL_BASED_OUTPATIENT_CLINIC_OR_DEPARTMENT_OTHER): Payer: Self-pay | Admitting: *Deleted

## 2011-09-02 ENCOUNTER — Encounter (HOSPITAL_BASED_OUTPATIENT_CLINIC_OR_DEPARTMENT_OTHER): Payer: Self-pay | Admitting: Anesthesiology

## 2011-09-02 DIAGNOSIS — M2350 Chronic instability of knee, unspecified knee: Secondary | ICD-10-CM | POA: Insufficient documentation

## 2011-09-02 DIAGNOSIS — F17200 Nicotine dependence, unspecified, uncomplicated: Secondary | ICD-10-CM | POA: Insufficient documentation

## 2011-09-02 DIAGNOSIS — K219 Gastro-esophageal reflux disease without esophagitis: Secondary | ICD-10-CM | POA: Insufficient documentation

## 2011-09-02 DIAGNOSIS — Z21 Asymptomatic human immunodeficiency virus [HIV] infection status: Secondary | ICD-10-CM | POA: Insufficient documentation

## 2011-09-02 DIAGNOSIS — M23305 Other meniscus derangements, unspecified medial meniscus, unspecified knee: Secondary | ICD-10-CM | POA: Insufficient documentation

## 2011-09-02 LAB — POCT HEMOGLOBIN-HEMACUE: Hemoglobin: 14.6 g/dL (ref 13.0–17.0)

## 2011-09-02 SURGERY — KNEE ARTHROSCOPY WITH ANTERIOR CRUCIATE LIGAMENT (ACL) REPAIR
Anesthesia: General | Site: Knee | Laterality: Right | Wound class: Clean

## 2011-09-02 MED ORDER — OXYCODONE-ACETAMINOPHEN 5-325 MG PO TABS: 1.0000 | ORAL_TABLET | ORAL | Status: AC | PRN

## 2011-09-02 MED ORDER — OXYCODONE-ACETAMINOPHEN 5-325 MG PO TABS
1.0000 | ORAL_TABLET | Freq: Once | ORAL | Status: AC | PRN
  Administered 2011-09-02: 1 via ORAL

## 2011-09-02 MED ORDER — DEXAMETHASONE SODIUM PHOSPHATE 10 MG/ML IJ SOLN
INTRAMUSCULAR | Status: DC | PRN
  Administered 2011-09-02: 10 mg via INTRAVENOUS

## 2011-09-02 MED ORDER — MIDAZOLAM HCL 2 MG/2ML IJ SOLN
0.5000 mg | INTRAMUSCULAR | Status: DC | PRN
  Administered 2011-09-02: 2 mg via INTRAVENOUS

## 2011-09-02 MED ORDER — ONDANSETRON HCL 4 MG/2ML IJ SOLN
INTRAMUSCULAR | Status: DC | PRN
  Administered 2011-09-02: 4 mg via INTRAVENOUS

## 2011-09-02 MED ORDER — SODIUM CHLORIDE 0.9 % IR SOLN
Status: DC | PRN
  Administered 2011-09-02: 12:00:00

## 2011-09-02 MED ORDER — ACETAMINOPHEN 10 MG/ML IV SOLN
1000.0000 mg | Freq: Once | INTRAVENOUS | Status: AC
  Administered 2011-09-02: 1000 mg via INTRAVENOUS

## 2011-09-02 MED ORDER — LACTATED RINGERS IV SOLN
INTRAVENOUS | Status: DC
Start: 2011-09-02 — End: 2011-09-02
  Administered 2011-09-02 (×3): via INTRAVENOUS

## 2011-09-02 MED ORDER — BUPIVACAINE-EPINEPHRINE PF 0.5-1:200000 % IJ SOLN
INTRAMUSCULAR | Status: DC | PRN
  Administered 2011-09-02: 25 mL

## 2011-09-02 MED ORDER — HYDROMORPHONE HCL PF 1 MG/ML IJ SOLN
0.2500 mg | INTRAMUSCULAR | Status: DC | PRN
  Administered 2011-09-02 (×3): 0.5 mg via INTRAVENOUS

## 2011-09-02 MED ORDER — PROPOFOL 10 MG/ML IV EMUL
INTRAVENOUS | Status: DC | PRN
  Administered 2011-09-02: 20 mg via INTRAVENOUS

## 2011-09-02 MED ORDER — FENTANYL CITRATE 0.05 MG/ML IJ SOLN
50.0000 ug | INTRAMUSCULAR | Status: DC | PRN
  Administered 2011-09-02: 100 ug via INTRAVENOUS

## 2011-09-02 MED ORDER — METOCLOPRAMIDE HCL 5 MG/ML IJ SOLN: 10.0000 mg | Freq: Once | INTRAMUSCULAR | Status: DC | PRN

## 2011-09-02 MED ORDER — CEFAZOLIN SODIUM 1-5 GM-% IV SOLN
1.0000 g | Freq: Once | INTRAVENOUS | Status: AC
  Administered 2011-09-02: 1 g via INTRAVENOUS

## 2011-09-02 MED ORDER — EPHEDRINE SULFATE 50 MG/ML IJ SOLN
INTRAMUSCULAR | Status: DC | PRN
  Administered 2011-09-02 (×3): 10 mg via INTRAVENOUS

## 2011-09-02 MED ORDER — FENTANYL CITRATE 0.05 MG/ML IJ SOLN
INTRAMUSCULAR | Status: DC | PRN
  Administered 2011-09-02 (×4): 25 ug via INTRAVENOUS

## 2011-09-02 MED ORDER — 0.9 % SODIUM CHLORIDE (POUR BTL) OPTIME
TOPICAL | Status: DC | PRN
  Administered 2011-09-02: 1000 mL

## 2011-09-02 MED ORDER — MORPHINE SULFATE 10 MG/ML IJ SOLN: 0.0500 mg/kg | INTRAMUSCULAR | Status: DC | PRN

## 2011-09-02 MED ORDER — CEFAZOLIN SODIUM 1-5 GM-% IV SOLN
INTRAVENOUS | Status: DC | PRN
  Administered 2011-09-02: 1 g via INTRAVENOUS

## 2011-09-02 MED ORDER — LIDOCAINE HCL (CARDIAC) 20 MG/ML IV SOLN
INTRAVENOUS | Status: DC | PRN
  Administered 2011-09-02: 100 mg via INTRAVENOUS

## 2011-09-02 MED ORDER — CLINDAMYCIN PHOSPHATE 300 MG/50ML IV SOLN: 300.0000 mg | Freq: Once | INTRAVENOUS | Status: DC

## 2011-09-02 SURGICAL SUPPLY — 72 items
BANDAGE ESMARK 6X9 LF (GAUZE/BANDAGES/DRESSINGS) IMPLANT
BENZOIN TINCTURE PRP APPL 2/3 (GAUZE/BANDAGES/DRESSINGS) ×2 IMPLANT
BLADE 4.2CUDA (BLADE) IMPLANT
BLADE AVERAGE 25X9 (BLADE) ×2 IMPLANT
BLADE CUDA 5.5 (BLADE) IMPLANT
BLADE CUTTER GATOR 3.5 (BLADE) IMPLANT
BLADE GREAT WHITE 4.2 (BLADE) ×2 IMPLANT
BLADE OSCIL/SAGITTAL W/10 ST (BLADE) IMPLANT
BLADE SURG 15 STRL LF DISP TIS (BLADE) ×1 IMPLANT
BLADE SURG 15 STRL SS (BLADE) ×1
BNDG ESMARK 6X9 LF (GAUZE/BANDAGES/DRESSINGS)
BUR OVAL 4.0 (BURR) ×2 IMPLANT
CANISTER OMNI JUG 16 LITER (MISCELLANEOUS) IMPLANT
CANISTER SUCTION 2500CC (MISCELLANEOUS) IMPLANT
CLOTH BEACON ORANGE TIMEOUT ST (SAFETY) ×2 IMPLANT
COVER TABLE BACK 60X90 (DRAPES) ×2 IMPLANT
DRAPE ARTHROSCOPY W/POUCH 114 (DRAPES) ×2 IMPLANT
DRSG EMULSION OIL 3X3 NADH (GAUZE/BANDAGES/DRESSINGS) ×2 IMPLANT
DURAPREP 26ML APPLICATOR (WOUND CARE) ×2 IMPLANT
ELECT MENISCUS 165MM 90D (ELECTRODE) IMPLANT
ELECT REM PT RETURN 9FT ADLT (ELECTROSURGICAL) ×2
ELECTRODE REM PT RTRN 9FT ADLT (ELECTROSURGICAL) ×1 IMPLANT
GLOVE BIOGEL PI IND STRL 8 (GLOVE) ×2 IMPLANT
GLOVE BIOGEL PI INDICATOR 8 (GLOVE) ×2
GLOVE ECLIPSE 7.0 STRL STRAW (GLOVE) ×2 IMPLANT
GLOVE ECLIPSE 7.5 STRL STRAW (GLOVE) ×4 IMPLANT
GLOVE INDICATOR 6.5 STRL GRN (GLOVE) ×2 IMPLANT
GOWN BRE IMP PREV XXLGXLNG (GOWN DISPOSABLE) ×2 IMPLANT
GOWN PREVENTION PLUS XLARGE (GOWN DISPOSABLE) ×4 IMPLANT
GOWN PREVENTION PLUS XXLARGE (GOWN DISPOSABLE) IMPLANT
HOLDER KNEE FOAM BLUE (MISCELLANEOUS) ×2 IMPLANT
IMMOBILIZER KNEE 22 UNIV (SOFTGOODS) IMPLANT
IMMOBILIZER KNEE 24 THIGH 36 (MISCELLANEOUS) ×1 IMPLANT
IMMOBILIZER KNEE 24 UNIV (MISCELLANEOUS) ×2
KIT TRANSTIBIAL (DISPOSABLE) ×2 IMPLANT
KNEE WRAP E Z 3 GEL PACK (MISCELLANEOUS) ×2 IMPLANT
KNIFE GRAFT ACL 10MM 5952 (MISCELLANEOUS) IMPLANT
KNIFE GRAFT ACL 11MM (MISCELLANEOUS) IMPLANT
NDL SAFETY ECLIPSE 18X1.5 (NEEDLE) IMPLANT
NEEDLE FILTER BLUNT 18X 1/2SAF (NEEDLE)
NEEDLE FILTER BLUNT 18X1 1/2 (NEEDLE) IMPLANT
NEEDLE HYPO 18GX1.5 SHARP (NEEDLE)
NEEDLE MENISCAL REPAIR DBL ARM (NEEDLE) IMPLANT
PACK ARTHROSCOPY DSU (CUSTOM PROCEDURE TRAY) ×2 IMPLANT
PACK BASIN DAY SURGERY FS (CUSTOM PROCEDURE TRAY) ×2 IMPLANT
PAD CAST 4YDX4 CTTN HI CHSV (CAST SUPPLIES) ×2 IMPLANT
PADDING CAST COTTON 4X4 STRL (CAST SUPPLIES) ×2
PASSER SUT SWANSON 36MM LOOP (INSTRUMENTS) IMPLANT
PATELLA LIGAMENT BISECTED FR (Tissue) ×2 IMPLANT
PENCIL BUTTON HOLSTER BLD 10FT (ELECTRODE) ×2 IMPLANT
SCREW INTERFERENCE 7X20MM (Screw) ×2 IMPLANT
SCREW SHEATHED INTERF 7X20 (Screw) ×2 IMPLANT
SET ARTHROSCOPY TUBING (MISCELLANEOUS) ×1
SET ARTHROSCOPY TUBING LN (MISCELLANEOUS) ×1 IMPLANT
SHEET MEDIUM DRAPE 40X70 STRL (DRAPES) ×2 IMPLANT
SPONGE GAUZE 4X4 12PLY (GAUZE/BANDAGES/DRESSINGS) ×2 IMPLANT
SPONGE LAP 4X18 X RAY DECT (DISPOSABLE) ×4 IMPLANT
STRIP CLOSURE SKIN 1/2X4 (GAUZE/BANDAGES/DRESSINGS) ×2 IMPLANT
SUCTION FRAZIER TIP 10 FR DISP (SUCTIONS) ×2 IMPLANT
SUT ETHILON 4 0 PS 2 18 (SUTURE) IMPLANT
SUT MNCRL AB 3-0 PS2 18 (SUTURE) ×2 IMPLANT
SUT PDS AB 1 CT  36 (SUTURE) ×2
SUT PDS AB 1 CT 36 (SUTURE) ×2 IMPLANT
SUT STEEL 5 (SUTURE) ×2 IMPLANT
SUT VIC AB 0 CT1 27 (SUTURE)
SUT VIC AB 0 CT1 27XBRD ANBCTR (SUTURE) IMPLANT
SUT VIC AB 2-0 SH 27 (SUTURE) ×1
SUT VIC AB 2-0 SH 27XBRD (SUTURE) ×1 IMPLANT
SYR 5ML LL (SYRINGE) IMPLANT
TOWEL OR 17X24 6PK STRL BLUE (TOWEL DISPOSABLE) ×6 IMPLANT
TOWEL OR NON WOVEN STRL DISP B (DISPOSABLE) ×2 IMPLANT
WATER STERILE IRR 1000ML POUR (IV SOLUTION) ×2 IMPLANT

## 2011-09-02 NOTE — Anesthesia Postprocedure Evaluation (Signed)
Anesthesia Post Note  Patient: [name] [name]  Procedure(s) Performed: Procedure(s) (LRB): KNEE ARTHROSCOPY WITH ANTERIOR CRUCIATE LIGAMENT (ACL) REPAIR (Right)  Anesthesia type: General  Patient location: PACU  Post pain: Pain level controlled  Post assessment: Patient's Cardiovascular Status Stable  Last Vitals:  Filed Vitals:   09/02/11 1430  BP: 136/77  Pulse: 88  Temp:   Resp: 18    Post vital signs: Reviewed and stable  Level of consciousness: alert  Complications: No apparent anesthesia complications

## 2011-09-02 NOTE — Brief Op Note (Addendum)
09/02/2011  1:20 PM  PATIENT:  [name] [name]  51 y.o. male  PRE-OPERATIVE DIAGNOSIS:  Anterior cruciate ligament tear on right  POST-OPERATIVE DIAGNOSIS:  Anterior cruciate ligament tear on right with MMT  PROCEDURE:  Procedure(s) (LRB): KNEE ARTHROSCOPY WITH ANTERIOR CRUCIATE LIGAMENT (ACL) REPAIR (Right) and medial menisectomy  SURGEON:  Surgeon(s) and Role:    * Harvie Junior, MD - Primary    ASSISTANTS:Laural Eiland PA-C  ANESTHESIA:   general  EBL:  Total I/O In: 2000 [I.V.:2000] Out: -   BLOOD ADMINISTERED:none  DRAINS: none   LOCAL MEDICATIONS USED:  NONE  SPECIMEN:  No Specimen  DISPOSITION OF SPECIMEN:  N/A  COUNTS:  YES  TOURNIQUET:  * Missing tourniquet times found for documented tourniquets in log:  [id number] *  DICTATION: .Other Dictation: Dictation Number [id number]  PLAN OF CARE: Discharge to home after PACU  PATIENT DISPOSITION:  PACU - hemodynamically stable.   Delay start of Pharmacological VTE agent (>24hrs) due to surgical blood loss or risk of bleeding: not applicable

## 2011-09-02 NOTE — Transfer of Care (Signed)
Immediate Anesthesia Transfer of Care Note  Patient: [name] [name]  Procedure(s) Performed: Procedure(s) (LRB): KNEE ARTHROSCOPY WITH ANTERIOR CRUCIATE LIGAMENT (ACL) REPAIR (Right)  Patient Location: PACU  Anesthesia Type: GA combined with regional for post-op pain  Level of Consciousness: sedated  Airway & Oxygen Therapy: Patient Spontanous Breathing and Patient connected to face mask oxygen  Post-op Assessment: Report given to PACU RN and Post -op Vital signs reviewed and stable  Post vital signs: Reviewed and stable  Complications: No apparent anesthesia complications

## 2011-09-02 NOTE — Anesthesia Procedure Notes (Addendum)
Anesthesia Regional Block:  Femoral nerve block  Pre-Anesthetic Checklist: ,, timeout performed, Correct Patient, Correct Site, Correct Laterality, Correct Procedure, Correct Position, site marked, Risks and benefits discussed,  Surgical consent,  Pre-op evaluation,  At surgeon's request and post-op pain management  Laterality: Right  Prep: chloraprep       Needles:   Needle Type: Other   (Arrow Echogenic)   Needle Length: 9cm  Needle Gauge: 21    Additional Needles:  Procedures: ultrasound guided Femoral nerve block Narrative:  Start time: 09/02/2011 10:40 AM End time: 09/02/2011 10:47 AM Injection made incrementally with aspirations every 5 mL.  Performed by: Personally  Anesthesiologist: C. Frederick MD  Additional Notes: Ultrasound guidance used to: id relevant anatomy, confirm needle position, local anesthetic spread, avoidance of vascular puncture. Picture saved. No complications. Block performed personally by Janetta Hora. Gelene Mink, MD    Femoral nerve block Procedure Name: LMA Insertion Date/Time: 09/02/2011 11:57 AM Performed by: Zenia Resides D Pre-anesthesia Checklist: Patient identified, Emergency Drugs available, Suction available and Patient being monitored Patient Re-evaluated:Patient Re-evaluated prior to inductionOxygen Delivery Method: Circle System Utilized Preoxygenation: Pre-oxygenation with 100% oxygen Intubation Type: IV induction Ventilation: Mask ventilation without difficulty LMA: LMA inserted LMA Size: 5.0 Number of attempts: 1 Airway Equipment and Method: bite block Placement Confirmation: positive ETCO2 Tube secured with: Tape Dental Injury: Teeth and Oropharynx as per pre-operative assessment

## 2011-09-02 NOTE — Op Note (Signed)
NAMEMILON, DETHLOFF             ACCOUNT NO.:  [id number]  MEDICAL RECORD NO.:  [id number]  LOCATION:  RAD                          FACILITY:  [facility]  PHYSICIAN:  Harvie Junior, M.D.   DATE OF BIRTH:  15-Mar-1961  DATE OF PROCEDURE:  09/02/2011 DATE OF DISCHARGE:  06/30/2011                              OPERATIVE REPORT   This is a 51 year old male.  PREOPERATIVE DIAGNOSIS:  Anterior cruciate ligament tear right.  POSTOPERATIVE DIAGNOSIS: 1. Anterior cruciate ligament tear right. 2. Medial meniscal tear. 3. Chondromalacia patellofemoral joint.  PROCEDURE: 1. Anterior cruciate ligament reconstruction with central 1/3 patellar     tendon allograft. 2. Partial medial meniscectomy. 3. Chondroplasty patellofemoral joint.  SURGEON:  Harvie Junior, M.D.  ASSISTANT:  Marshia Ly, P.A.  ANESTHESIA:  General.  BRIEF HISTORY:  Mr. Cosman is a 51 year old gentleman with a history of having had an ACL tear some time in the remote past.  He has had episodes of instability multiple times over a period of time.  He was concerned about his difficulty with instability and because of continued complaints of instability we evaluated in the office.  I had a long talk about treatment options, felt that it 51.  There is some risk that this could be a somewhat of an arthritic situation.  His preoperative x-ray standing showed no evidence of arthritic wear and given that the instability was his primary complaint.  We ultimately talked about treatment options and felt that bringing him to the operating room for ACL reconstruction was appropriate and he is brought to the operating room this procedure.  PROCEDURE:  The patient was brought to the operating room.  After adequate anesthesia was induced with general anesthetic, the patient was placed supine on the operating table.  The right knee was then prepped and draped in usual sterile fashion.  Once this was completed the knee was examined  under anesthesia, noted be pivot positive Lachman positive. At this time, the routine arthroscopic examination revealed there is chondromalacia of the patellofemoral joint.  This was debrided back to a smooth, stable rim.  Attention turned to the medial compartment where there was a mid body medial meniscal tear which was debrided back to a smooth, stable rim.  Once this was completed, attention turned to the ACL were the notch was empty.  There was some fibers of an old remnant ACL, but this was going to superior and at this point we examined him under direct vision and there was really no restraint anterior translation.  At this point, attention turned laterally where the lateral meniscus was normal.  Posterior back to the notch where the notch showed the old ACL, the ACL was then debrided out and at this point, a guide was placed into the stump of the old ACL and the stump of the old ACL and the guide was then placed against the distal tibia.  A guidewire was placed into the exact location of the old ACL origin and this was over-reamed with a 10 mm reamer.  Once this was done and over- the-top guide was used and the guidewire was passed out the distal lateral femur and once this was  accomplished, the proximal drill tunnel was drilled with a 9 mm reamer up to a 25 mm depth.  Once this was completed, attention turned towards the drilling of this.  This was over drilled with a cannulated reamer, and excellent notch.  Once this was over-reamed, the camera was used to make sure this was a tunnel and the back wall was intact and it was, and then a notch it was used to make sure that we had placed a starter screw.  Once this was done, the graft was passed into the knee.  This gave excellent location tension and it was locked on the femoral side with a 7 x 20 screw sheath to protect the graft.  On the tibial side was locked by 7 x 20 screw with no sheath and this gave excellent fixation.  Prior  to placing the tibial side screw the knee was cycled 20 times to make sure there was no tendency to be an isometric with the component.  There was none at this point.  The wound was copiously and thoroughly lavaged and suctioned dry.  The tibial side was locked in as outlined with a 7 x 20 screw, no sheath and the camera was then put back in.  They to watch the ACL worker rather PCL.  Thought in full extension and tension looked medial laterally, to make sure there was no looser fragmenting pieces of bone or none being found. Attention was then turned back towards the notch where the final pictures were taken.  The camera was then removed, the knee was then suctioned dry.  The small incision where the tibial side screw was placed was closed with 0 Vicryl and a 3-0 Monocryl subcuticular.  Once this was completed, the attention was turned towards the closure of the wounds which were closed with a bandage.  The distal wound was closed as outlined with 2-0 Vicryl Monocryl subcuticular.  Benzoin and Steri- Strips were applied.  Sterile compressive dressing was applied.  The patient taken to recovery room, where has was noted to be in satisfactory condition.  Estimated blood loss for the procedure was less than 10 mL.     Harvie Junior, M.D.     Ranae Plumber  D:  09/02/2011  T:  09/02/2011  Job:  [id number]

## 2011-09-02 NOTE — Anesthesia Preprocedure Evaluation (Signed)
Anesthesia Evaluation  Patient identified by MRN, date of birth, ID band Patient awake    Reviewed: Allergy & Precautions, H&P , NPO status , Patient's Chart, lab work & pertinent test results, reviewed documented beta blocker date and time   Airway Mallampati: II TM Distance: >3 FB Neck ROM: full    Dental   Pulmonary neg pulmonary ROS,          Cardiovascular negative cardio ROS      Neuro/Psych negative neurological ROS  negative psych ROS   GI/Hepatic Neg liver ROS, GERD-  Medicated and Controlled,  Endo/Other  negative endocrine ROS  Renal/GU negative Renal ROS  negative genitourinary   Musculoskeletal   Abdominal   Peds  Hematology  (+) HIV,   Anesthesia Other Findings See surgeon's H&P   Reproductive/Obstetrics negative OB ROS                           Anesthesia Physical Anesthesia Plan  ASA: II  Anesthesia Plan: General   Post-op Pain Management:    Induction: Intravenous  Airway Management Planned: LMA  Additional Equipment:   Intra-op Plan:   Post-operative Plan: Extubation in OR  Informed Consent: I have reviewed the patients History and Physical, chart, labs and discussed the procedure including the risks, benefits and alternatives for the proposed anesthesia with the patient or authorized representative who has indicated his/her understanding and acceptance.     Plan Discussed with: CRNA and Surgeon  Anesthesia Plan Comments:         Anesthesia Quick Evaluation

## 2011-09-02 NOTE — Discharge Instructions (Signed)
POST-OP ACL RECONSTRUCTION  1. Call the office at [phone] to schedule your first post-op appointment  Next Monday from the date of your surgery.  2. Leave the steri-strips in place over your incisions when performing dressing changes and showering. Remove your dressings tomorrow to perform first dressings change. Then change dressing daily or as needed. You may shower and get incision wet on day 5 from surgery. Do not submerge incision in tub or under water.  3. Wear your knee immobilizer or hinged knee brace to sleep and at all times while up. You may put full weight on operative leg with brace on. Crutches are for comfort. Remove brace to perform attached exercises.  4. It is strongly recommended to ice and elevate your leg on a regular basis and at a minimum of 4 times a day for 30 minute sessions. You should ice after your exercise sessions and more if you are having a lot of pain or increase in swelling.  5. The thigh high Ted hose (white stockings) help to decrease swelling and prevent blood clots. It is recommended that you wear them on left leg for the first 3 days. Then you should wear on the operative extremity when upright but can remove to sleep.  6. If you had a block pre-operatively to provide post-op pain relief you may want to go ahead and begin utilizing your pain meds as your arm begins to wake up. Blocks can sometimes last up to 16-18 hours. If you are still pain-free prior to going to bed you may want to strongly consider taking a pain medication to avoid being awakened in the night with the onset of pain. A muscle relaxant is also provided for you should you experience muscle spasms. It is recommended that if you are experiencing pain that your pain medication alone is not controlling, add the muscle relaxant along with the pain medication which can give additional pain relief. The first one to two days is generally the most severe of your pain and then should gradually  decrease. As your pain lessens it is recommended that you decrease your use of the pain medications to an "as needed basis" only and to always comply with the recommended dosages of the pain medications.  7. Pain medications can produce constipation along with their use. If you experience this, the use of an over the counter stool softener or laxative daily is recommended.   8. For additional questions or concerns, please do not hesitate to call the office. If after hours there is an answering service to forward your concerns to the physician on call.   POST-OP EXERCISES  Specializing in Arthroscopic and Reconstructive Repeat each exercise 10 times per set. Do 3 sets per session. Do 3 sessions per day.  Ankle/Foot Range of Motion  With leg relaxed, gently flex and extend ankle. Move through full range of motion.     Knee Extension Mobilization: Towel Prop  With a small towel rolled under your ankle, relax your leg to feel a comfortable stretch along the backside of your knee/leg. Hold for 3-5 minutes.     Hip/Knee Strengthening: Quadriceps Set  Tighten your quadriceps (top of thigh) by pulling your patella (kneecap) toward your hip. Keep your buttocks relaxed. Hold for 5-10 seconds.     Hip/Knee Strengthening: Straight Leg Raise  With your brace on, tighten your quadriceps and lift your leg 12-18 inches from surface.     Hip/Knee Stretching: Calf - Towel  Sit with  knee straight and towel looped around your foot. Gently pull on towel until stretch is felt in calf. Hold for 20-30 seconds.     Hip/Knee: Knee Flexion  With a towel around your heel, gently pull knee up with towel until stretch is felt. Hold 20-30 seconds.     Discharge Instructions After Orthopedic Procedures:  *You may feel tired and weak following your procedure. It is recommended that you limit physical activity for the next 24 hours and rest at home for the remainder of today and tomorrow. *No  strenuous activity should be started without your doctor's permission.  Elevate the extremity that you had surgery on to a level above your heart. This should continue for 48 hours or as instructed by your doctor.  If you had hand, arm or shoulder surgery you should move your fingers frequently unless otherwise instructed by your doctor.  If you had foot, knee or leg surgery you should wiggle your toes frequently unless otherwise instructed by your doctor.  Follow your doctor's exact instructions for activity at home. Use your home equipment as instructed. (Crutches, hard shoes, slings etc.)  Limit your activity as instructed by your doctor.  Report to your doctor should any of the following occur: 1. Extreme swelling of your fingers or toes. 2. Inability to wiggle your fingers or toes. 3. Coldness, pale or bluish color in your fingers or toes. 4. Loss of sensation, numbness or tingling of your fingers or toes. 5. Unusual smell or odor from under your dressing or cast. 6. Excessive bleeding or drainage from the surgical site. 7. Pain not relieved by medication your doctor has prescribed for you. 8. Cast or dressing too tight (do not get your dressing or cast wet or put anything under          your dressing or cast.)  *Do not change your dressing unless instructed by your doctor or discharge nurse. Then follow exact instructions.  *Follow labeled instructions for any medications that your doctor may have prescribed for you. *Should any questions or complications develop following your procedure, PLEASE CONTACT YOUR DOCTOR.        Regional Anesthesia Blocks  1. Numbness or the inability to move the "blocked" extremity may last from 3-48 hours after placement. The length of time depends on the medication injected and your individual response to the medication. If the numbness is not going away after 48 hours, call your surgeon.  2. The extremity that is blocked will need to be  protected until the numbness is gone and the  Strength has returned. Because you cannot feel it, you will need to take extra care to avoid injury. Because it may be weak, you may have difficulty moving it or using it. You may not know what position it is in without looking at it while the block is in effect.  3. For blocks in the legs and feet, returning to weight bearing and walking needs to be done carefully. You will need to wait until the numbness is entirely gone and the strength has returned. You should be able to move your leg and foot normally before you try and bear weight or walk. You will need someone to be with you when you first try to ensure you do not fall and possibly risk injury.  4. Bruising and tenderness at the needle site are common side effects and will resolve in a few days.  5. Persistent numbness or new problems with movement should be communicated to  the surgeon or the [facility] [phone] [facility] [phone]).    Post Anesthesia Home Care Instructions  Activity: Get plenty of rest for the remainder of the day. A responsible adult should stay with you for 24 hours following the procedure.  For the next 24 hours, DO NOT: -Drive a car -Advertising copywriter -Drink alcoholic beverages -Take any medication unless instructed by your physician -Make any legal decisions or sign important papers.  Meals: Start with liquid foods such as gelatin or soup. Progress to regular foods as tolerated. Avoid greasy, spicy, heavy foods. If nausea and/or vomiting occur, drink only clear liquids until the nausea and/or vomiting subsides. Call your physician if vomiting continues.  Special Instructions/Symptoms: Your throat may feel dry or sore from the anesthesia or the breathing tube placed in your throat during surgery. If this causes discomfort, gargle with warm salt water. The discomfort should disappear within 24 hours.

## 2011-09-02 NOTE — Progress Notes (Signed)
Assisted Dr. Frederick with right, ultrasound guided, femoral block. Side rails up, monitors on throughout procedure. See vital signs in flow sheet. Tolerated Procedure well. 

## 2011-09-02 NOTE — H&P (Signed)
PREOPERATIVE H&P  Chief Complaint: r. Knee instability  HPI: [name] [name] is a 51 y.o. male who presents for evaluation of r. Knee instability. 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 He has failed conservative measures. Pain is rated as moderate.  Past Medical History  Diagnosis Date  . GERD (gastroesophageal reflux disease)   . HIV infection   . Hemorrhoids   . Smoker    Past Surgical History  Procedure Date  . Colonoscopy 2008   History   Social History  . Marital Status: Single    Spouse Name: N/A    Number of Children: N/A  . Years of Education: N/A   Social History Main Topics  . Smoking status: Current Some Day Smoker -- 1.0 packs/day    Types: Cigarettes  . Smokeless tobacco: None  . Alcohol Use: Yes     occ  . Drug Use: No  . Sexually Active: None   Other Topics Concern  . None   Social History Narrative  . None   Family History  Problem Relation Age of Onset  . Cancer Mother   . Cancer Father   . Cancer Paternal Uncle   . Cancer Maternal Grandmother    Allergies  Allergen Reactions  . Penicillins Nausea And Vomiting   Prior to Admission medications   Medication Sig Start Date End Date Taking? Authorizing Provider  azithromycin (ZITHROMAX) 600 MG tablet Take 2 tablets (1,200 mg total) by mouth every 7 (seven) days. 02/15/11  Yes Ronnald Nian, MD  busPIRone (BUSPAR) 15 MG tablet Take 1 tablet (15 mg total) by mouth 2 (two) times daily. 08/29/11 08/28/12 Yes Ronnald Nian, MD  clonazePAM (KLONOPIN) 0.5 MG tablet Take 1 tablet (0.5 mg total) by mouth 2 (two) times daily as needed for anxiety. 08/04/11 08/03/12 Yes Ronnald Nian, MD  efavirenz-emtrictabine-tenofovir (ATRIPLA) 600-200-300 MG per tablet Take 1 tablet by mouth at bedtime. 03/24/11  Yes Ronnald Nian, MD  omeprazole (PRILOSEC) 20 MG capsule Take 20 mg by mouth daily.     Yes Historical Provider, MD  sulfamethoxazole-trimethoprim (BACTRIM  DS) 800-160 MG per tablet TAKE 1 TABLET BY MOUTH ONCE DAILY 02/18/11  Yes Ronnald Nian, MD  Sulfamethoxazole-Trimethoprim (SEPTRA PO) Take 0.5 mg by mouth daily.     Yes Historical Provider, MD  venlafaxine (EFFEXOR) 75 MG tablet Take 1 tablet (75 mg total) by mouth 2 (two) times daily. 06/30/11 06/29/12 Yes Ronnald Nian, MD     Positive ROS: none  All other systems have been reviewed and were otherwise negative with the exception of those mentioned in the HPI and as above.  Physical Exam: Filed Vitals:   09/02/11 1048  BP: 124/78  Pulse: 83  Temp:   Resp: 17    General: Alert, no acute distress Cardiovascular: No pedal edema Respiratory: No cyanosis, no use of accessory musculature GI: No organomegaly, abdomen is soft and non-tender Skin: No lesions in the area of chief complaint Neurologic: Sensation intact distally Psychiatric: Patient is competent for consent with normal mood and affect Lymphatic: No axillary or cervical lymphadenopathy  MUSCULOSKELETAL: r. Knee + insability + mcmurray. No med or lat instability. No degenerative changes on x-ray  Assessment/Plan: acl tear on right Plan for Procedure(s): KNEE ARTHROSCOPY WITH ANTERIOR CRUCIATE LIGAMENT (ACL) REPAIR  The risks benefits and alternatives were discussed with the patient including but not limited to the risks of nonoperative treatment, versus surgical  intervention including infection, bleeding, nerve injury, malunion, nonunion, hardware prominence, hardware failure, need for hardware removal, blood clots, cardiopulmonary complications, morbidity, mortality, among others, and they were willing to proceed.  Predicted outcome is good, although there will be at least a six to nine month expected recovery.  Bodhi Stenglein L, MD 09/02/2011 11:06 AM

## 2011-09-14 HISTORY — PX: KNEE ARTHROSCOPY WITH ANTERIOR CRUCIATE LIGAMENT (ACL) REPAIR: SHX5644

## 2011-09-19 ENCOUNTER — Other Ambulatory Visit: Payer: Self-pay

## 2011-09-19 MED ORDER — CLONAZEPAM 0.5 MG PO TABS: 0.5000 mg | ORAL_TABLET | Freq: Two times a day (BID) | ORAL | Status: DC | PRN

## 2011-09-19 NOTE — Telephone Encounter (Signed)
Med called in per jcl 

## 2011-09-19 NOTE — Telephone Encounter (Signed)
Renew the Klonopin

## 2011-09-19 NOTE — Telephone Encounter (Signed)
Pt called and asked if you would please refill his klonpin he has been off his pain pills for 1 week and when he is doing his exercise he cant remember what number he is on he has ov 5/14

## 2011-09-27 ENCOUNTER — Ambulatory Visit (INDEPENDENT_AMBULATORY_CARE_PROVIDER_SITE_OTHER): Payer: 59 | Admitting: Family Medicine

## 2011-09-27 ENCOUNTER — Encounter: Payer: Self-pay | Admitting: Family Medicine

## 2011-09-27 VITALS — BP 110/70 | HR 110 | Wt 143.0 lb

## 2011-09-27 DIAGNOSIS — F341 Dysthymic disorder: Secondary | ICD-10-CM

## 2011-09-27 DIAGNOSIS — F419 Anxiety disorder, unspecified: Secondary | ICD-10-CM

## 2011-09-27 DIAGNOSIS — Z21 Asymptomatic human immunodeficiency virus [HIV] infection status: Secondary | ICD-10-CM

## 2011-09-27 DIAGNOSIS — Z9889 Other specified postprocedural states: Secondary | ICD-10-CM

## 2011-09-27 MED ORDER — VENLAFAXINE HCL 100 MG PO TABS: 100.0000 mg | ORAL_TABLET | Freq: Two times a day (BID) | ORAL | Status: DC

## 2011-09-27 NOTE — Progress Notes (Signed)
  Subjective:    Patient ID: [name] [name], male    DOB: [birth year], 51 y.o.   MRN: [id number]  HPI He is here for a followup visit. He had anterior cruciate ligament repair in late April. He is now roughly 3 weeks down the road and is doing quite well. He continues in physical therapy for rehabilitation. He also continues on his Effexor as well as Klonopin and BuSpar. He seems to be holding his own and is under less stress since he is not at work. He still does take the Klonopin once per day usually in the middle of the day because he states his mind races. He continues on his HIV medications and has had no difficulties with them. Specifically no fever, chills, headache, weight change.   Review of Systems     Objective:   Physical Exam Alert and in no distress. Right knee shows evidence of recent surgical scars but no effusion. No pain on motion.       Assessment & Plan:   1. S/P repair of anterior cruciate ligament    2. Anxiety and depression  venlafaxine (EFFEXOR) 100 MG tablet  3. HIV positive  HIV 1 RNA quant-no reflex-bld, T-helper cells (CD4) count   I will increase his Effexor to 100 mg twice per day. He is to continue to decrease his Klonopin dosing to one pill per day or less. Recheck her in several months.

## 2011-09-28 LAB — T-HELPER CELLS (CD4) COUNT (NOT AT ARMC)
Absolute CD4: 196 /uL — ABNORMAL LOW (ref 381–1469)
CD4 T Helper %: 16 % — ABNORMAL LOW (ref 32–62)
Total Lymphocyte: 24 % (ref 12–46)
WBC, lymph enumeration: 5.1 10*3/uL (ref 4.0–10.5)

## 2011-10-21 ENCOUNTER — Other Ambulatory Visit: Payer: Self-pay | Admitting: Family Medicine

## 2011-10-24 ENCOUNTER — Encounter: Payer: Self-pay | Admitting: Internal Medicine

## 2011-10-31 ENCOUNTER — Other Ambulatory Visit: Payer: Self-pay

## 2011-10-31 NOTE — Telephone Encounter (Signed)
[name] [name] CALLED AND ASKED IF YOU WOULD REFILL HIS MED SAID HE MADE IT LAST 45 DAYS

## 2011-11-29 ENCOUNTER — Encounter: Payer: Self-pay | Admitting: Family Medicine

## 2011-11-29 ENCOUNTER — Ambulatory Visit (INDEPENDENT_AMBULATORY_CARE_PROVIDER_SITE_OTHER): Payer: 59 | Admitting: Family Medicine

## 2011-11-29 VITALS — BP 120/80 | HR 94 | Wt 146.0 lb

## 2011-11-29 DIAGNOSIS — M7120 Synovial cyst of popliteal space [Baker], unspecified knee: Secondary | ICD-10-CM

## 2011-11-29 DIAGNOSIS — Z9889 Other specified postprocedural states: Secondary | ICD-10-CM

## 2011-11-29 DIAGNOSIS — F419 Anxiety disorder, unspecified: Secondary | ICD-10-CM

## 2011-11-29 DIAGNOSIS — F341 Dysthymic disorder: Secondary | ICD-10-CM

## 2011-11-29 MED ORDER — VENLAFAXINE HCL 75 MG PO TABS: 75.0000 mg | ORAL_TABLET | Freq: Two times a day (BID) | ORAL | Status: DC

## 2011-11-29 NOTE — Patient Instructions (Addendum)
Insomnia Insomnia is frequent trouble falling and/or staying asleep. Insomnia can be a long term problem or a short term problem. Both are common. Insomnia can be a short term problem when the wakefulness is related to a certain stress or worry. Long term insomnia is often related to ongoing stress during waking hours and/or poor sleeping habits. Overtime, sleep deprivation itself can make the problem worse. Every little thing feels more severe because you are overtired and your ability to cope is decreased. CAUSES   Stress, anxiety, and depression.   Poor sleeping habits.   Distractions such as TV in the bedroom.   Naps close to bedtime.   Engaging in emotionally charged conversations before bed.   Technical reading before sleep.   Alcohol and other sedatives. They may make the problem worse. They can hurt normal sleep patterns and normal dream activity.   Stimulants such as caffeine for several hours prior to bedtime.   Pain syndromes and shortness of breath can cause insomnia.   Exercise late at night.   Changing time zones may cause sleeping problems (jet lag).  It is sometimes helpful to have someone observe your sleeping patterns. They should look for periods of not breathing during the night (sleep apnea). They should also look to see how long those periods last. If you live alone or observers are uncertain, you can also be observed at a sleep clinic where your sleep patterns will be professionally monitored. Sleep apnea requires a checkup and treatment. Give your caregivers your medical history. Give your caregivers observations your family has made about your sleep.  SYMPTOMS   Not feeling rested in the morning.   Anxiety and restlessness at bedtime.   Difficulty falling and staying asleep.  TREATMENT   Your caregiver may prescribe treatment for an underlying medical disorders. Your caregiver can give advice or help if you are using alcohol or other drugs for  self-medication. Treatment of underlying problems will usually eliminate insomnia problems.   Medications can be prescribed for short time use. They are generally not recommended for lengthy use.   Over-the-counter sleep medicines are not recommended for lengthy use. They can be habit forming.   You can promote easier sleeping by making lifestyle changes such as:   Using relaxation techniques that help with breathing and reduce muscle tension.   Exercising earlier in the day.   Changing your diet and the time of your last meal. No night time snacks.   Establish a regular time to go to bed.   Counseling can help with stressful problems and worry.   Soothing music and white noise may be helpful if there are background noises you cannot remove.   Stop tedious detailed work at least one hour before bedtime.  HOME CARE INSTRUCTIONS   Keep a diary. Inform your caregiver about your progress. This includes any medication side effects. See your caregiver regularly. Take note of:   Times when you are asleep.   Times when you are awake during the night.   The quality of your sleep.   How you feel the next day.  This information will help your caregiver care for you.  Get out of bed if you are still awake after 15 minutes. Read or do some quiet activity. Keep the lights down. Wait until you feel sleepy and go back to bed.   Keep regular sleeping and waking hours. Avoid naps.   Exercise regularly.   Avoid distractions at bedtime. Distractions include watching television or engaging   in any intense or detailed activity like attempting to balance the household checkbook.   Develop a bedtime ritual. Keep a familiar routine of bathing, brushing your teeth, climbing into bed at the same time each night, listening to soothing music. Routines increase the success of falling to sleep faster.   Use relaxation techniques. This can be using breathing and muscle tension release routines. It can  also include visualizing peaceful scenes. You can also help control troubling or intruding thoughts by keeping your mind occupied with boring or repetitive thoughts like the old concept of counting sheep. You can make it more creative like imagining planting one beautiful flower after another in your backyard garden.   During your day, work to eliminate stress. When this is not possible use some of the previous suggestions to help reduce the anxiety that accompanies stressful situations.  MAKE SURE YOU:   Understand these instructions.   Will watch your condition.   Will get help right away if you are not doing well or get worse.  Document Released: 04/29/2000 Document Revised: 04/21/2011 Document Reviewed: 05/30/2007 [facility] Patient Information 2012 [place], [place]. Call me in one month and let me know how you're doing

## 2011-11-29 NOTE — Progress Notes (Signed)
  Subjective:    Patient ID: [name] [name], male    DOB: 1960-09-17, 51 y.o.   MRN: [id number]  HPI He is here for a recheck. He has been off Klonopin for the last 2 weeks since he ran out. He states that he has had difficulty falling asleep. He is using melatonin which does help slightly. He states he has a hard time shutting his brain off. He states he has not having as much difficulty with anger at work because he is not interacting with most people. On his last visit the Effexor was increased. He states he has not noted much improvement. He continues to have difficulty with his left knee swelling and with a cyst posteriorly. He is planning to have this drained again.   Review of Systems     Objective:   Physical Exam Alert and in no distress otherwise not examined.       Assessment & Plan:   1. Anxiety and depression  venlafaxine (EFFEXOR) 75 MG tablet  2. S/P ACL repair    3. Baker's cyst of knee     I will increase his Effexor and see what that does to help with his sleep. He will be on 150 twice a day. I again stressed the need for him to take more control over his life and in not allowing other people to control of his emotions. Encouraged him to followup with Darryl Hyers. He has not seen her due to financial concerns. He is to  followup with me in one month. Information given concerning sleep hygiene.

## 2011-12-28 ENCOUNTER — Telehealth: Payer: Self-pay | Admitting: Internal Medicine

## 2011-12-28 ENCOUNTER — Telehealth: Payer: Self-pay | Admitting: Family Medicine

## 2011-12-28 DIAGNOSIS — F419 Anxiety disorder, unspecified: Secondary | ICD-10-CM

## 2011-12-28 MED ORDER — VENLAFAXINE HCL 75 MG PO TABS: 75.0000 mg | ORAL_TABLET | Freq: Two times a day (BID) | ORAL | Status: DC

## 2011-12-28 NOTE — Telephone Encounter (Signed)
Dr. Susann Givens approved for it to be sent as 90 days and 1 refill

## 2011-12-28 NOTE — Telephone Encounter (Signed)
dt ?

## 2012-03-08 ENCOUNTER — Other Ambulatory Visit: Payer: Self-pay | Admitting: Family Medicine

## 2012-03-08 NOTE — Telephone Encounter (Signed)
IS THIS OK 

## 2012-03-09 NOTE — Telephone Encounter (Signed)
Renew this. But make sure he has an appointment for next month

## 2012-03-09 NOTE — Telephone Encounter (Signed)
Called pt and left message stating that a medicine was sent to the pharmacy and Dr. Susann Givens wanted him to come in sometime next month to see him for an appt

## 2012-03-27 ENCOUNTER — Encounter: Payer: Self-pay | Admitting: Family Medicine

## 2012-03-27 ENCOUNTER — Ambulatory Visit (INDEPENDENT_AMBULATORY_CARE_PROVIDER_SITE_OTHER): Payer: 59 | Admitting: Family Medicine

## 2012-03-27 VITALS — BP 126/80 | HR 86 | Wt 149.0 lb

## 2012-03-27 DIAGNOSIS — H6120 Impacted cerumen, unspecified ear: Secondary | ICD-10-CM

## 2012-03-27 DIAGNOSIS — Z23 Encounter for immunization: Secondary | ICD-10-CM

## 2012-03-27 DIAGNOSIS — Z21 Asymptomatic human immunodeficiency virus [HIV] infection status: Secondary | ICD-10-CM

## 2012-03-27 DIAGNOSIS — Z79899 Other long term (current) drug therapy: Secondary | ICD-10-CM

## 2012-03-27 DIAGNOSIS — F341 Dysthymic disorder: Secondary | ICD-10-CM

## 2012-03-27 DIAGNOSIS — F419 Anxiety disorder, unspecified: Secondary | ICD-10-CM

## 2012-03-27 LAB — CBC WITH DIFFERENTIAL/PLATELET
Basophils Absolute: 0 10*3/uL (ref 0.0–0.1)
Basophils Relative: 1 % (ref 0–1)
Eosinophils Absolute: 0.1 10*3/uL (ref 0.0–0.7)
Eosinophils Relative: 3 % (ref 0–5)
HCT: 43.4 % (ref 39.0–52.0)
Hemoglobin: 15.5 g/dL (ref 13.0–17.0)
Lymphocytes Relative: 25 % (ref 12–46)
Lymphs Abs: 0.8 10*3/uL (ref 0.7–4.0)
MCH: 34.6 pg — ABNORMAL HIGH (ref 26.0–34.0)
MCHC: 35.7 g/dL (ref 30.0–36.0)
MCV: 96.9 fL (ref 78.0–100.0)
Monocytes Absolute: 0.6 10*3/uL (ref 0.1–1.0)
Monocytes Relative: 18 % — ABNORMAL HIGH (ref 3–12)
Neutro Abs: 1.8 10*3/uL (ref 1.7–7.7)
Neutrophils Relative %: 53 % (ref 43–77)
Platelets: 254 10*3/uL (ref 150–400)
RBC: 4.48 MIL/uL (ref 4.22–5.81)
RDW: 13.3 % (ref 11.5–15.5)
WBC: 3.2 10*3/uL — ABNORMAL LOW (ref 4.0–10.5)

## 2012-03-27 LAB — LIPID PANEL
Cholesterol: 194 mg/dL (ref 0–200)
HDL: 92 mg/dL (ref >39)
LDL Cholesterol: 81 mg/dL (ref 0–99)
Total CHOL/HDL Ratio: 2.1 Ratio
Triglycerides: 104 mg/dL (ref <150)
VLDL: 21 mg/dL (ref 0–40)

## 2012-03-27 LAB — COMPREHENSIVE METABOLIC PANEL
ALT: 34 U/L (ref 0–53)
BUN: 8 mg/dL (ref 6–23)
CO2: 25 mEq/L (ref 19–32)
Calcium: 9.7 mg/dL (ref 8.4–10.5)
Chloride: 104 mEq/L (ref 96–112)
Creat: 0.99 mg/dL (ref 0.50–1.35)
Glucose, Bld: 89 mg/dL (ref 70–99)

## 2012-03-27 MED ORDER — VENLAFAXINE HCL 100 MG PO TABS: 100.0000 mg | ORAL_TABLET | Freq: Two times a day (BID) | ORAL | Status: DC

## 2012-03-27 MED ORDER — EFAVIRENZ-EMTRICITAB-TENOFOVIR 600-200-300 MG PO TABS: 1.0000 | ORAL_TABLET | Freq: Every day | ORAL | Status: DC

## 2012-03-27 MED ORDER — SULFAMETHOXAZOLE-TMP DS 800-160 MG PO TABS: 1.0000 | ORAL_TABLET | Freq: Once | ORAL | Status: DC

## 2012-03-27 MED ORDER — INFLUENZA VIRUS VACC SPLIT PF IM SUSP: 0.5000 mL | Freq: Once | INTRAMUSCULAR | Status: DC

## 2012-03-27 NOTE — Patient Instructions (Signed)
You may stop the azithromycin

## 2012-03-27 NOTE — Progress Notes (Signed)
  Subjective:    Patient ID: [name] [name], male    DOB: 1961/05/10, 51 y.o.   MRN: [id number]  HPI He is here for medication check. He continues on his Atripla and is having no difficulty with this. He is not sure whether the BuSpar is doing any good. He seems to be doing fairly well on the Effexor. He states he is taking melatonin several times a day to help with his anxiety and states that it helps. He has not seen his psychologist in several months. He still is having some difficulty with knee pain and swelling after his surgery. He has had no fever, chills, headache, nausea, vomiting or weight change.   Review of Systems Negative except as above    Objective:   Physical Exam alert and in no distress. Tympanic membranes are normal after cerumen was removed from the right canal ;canals are normal. Throat is clear. Tonsils are normal. Neck is supple without adenopathy or thyromegaly. Cardiac exam shows a regular sinus rhythm without murmurs or gallops. Lungs are clear to auscultation. Abdominal exam shows normal bowel sounds without masses or tenderness.        Assessment & Plan:   1. Need for prophylactic vaccination and inoculation against influenza  influenza  inactive virus vaccine (FLUZONE/FLUARIX) injection 0.5 mL  2. HIV positive  sulfamethoxazole-trimethoprim (BACTRIM DS) 800-160 MG per tablet, efavirenz-emtricitabine-tenofovir (ATRIPLA) 600-200-300 MG per tablet, CBC with Differential, Comprehensive metabolic panel, HIV 1 RNA quant-no reflex-bld, T-helper cells (CD4) count  3. Anxiety and depression  venlafaxine (EFFEXOR) 100 MG tablet  4. Cerumen impaction    5. Encounter for long-term (current) use of other medications  Lipid panel   recommend he stop the azithromycin since his CD4 counts have been fairly stable. I will do routine blood screening on him. Did increase his Effexor to 100 mg twice a day. Flu shot given with benefits and risks discussed. May possibly stop his  BuSpar in the near future.

## 2012-03-28 LAB — T-HELPER CELLS (CD4) COUNT (NOT AT ARMC)
Total Lymphocyte: 25 % (ref 12–46)
WBC, lymph enumeration: 3.2 10*3/uL — ABNORMAL LOW (ref 4.0–10.5)

## 2012-03-28 LAB — HIV-1 RNA QUANT-NO REFLEX-BLD
HIV 1 RNA Quant: <20 copies/mL (ref <20)
HIV-1 RNA Quant, Log: <1.3 {Log} (ref <1.30)

## 2012-03-29 NOTE — Progress Notes (Signed)
Quick Note:  The blood work in general looks good. Viral load is undetectable. CD4 count is down slightly but not statistically significant. He can stop his azithromycin ______

## 2012-07-30 ENCOUNTER — Ambulatory Visit: Payer: 59 | Admitting: Family Medicine

## 2012-07-30 ENCOUNTER — Encounter: Payer: Self-pay | Admitting: Family Medicine

## 2012-07-30 VITALS — BP 120/84 | HR 108 | Wt 146.0 lb

## 2012-07-30 DIAGNOSIS — R079 Chest pain, unspecified: Secondary | ICD-10-CM

## 2012-07-30 DIAGNOSIS — M7702 Medial epicondylitis, left elbow: Secondary | ICD-10-CM

## 2012-07-30 DIAGNOSIS — Z21 Asymptomatic human immunodeficiency virus [HIV] infection status: Secondary | ICD-10-CM

## 2012-07-30 DIAGNOSIS — F419 Anxiety disorder, unspecified: Secondary | ICD-10-CM

## 2012-07-30 DIAGNOSIS — M7700 Medial epicondylitis, unspecified elbow: Secondary | ICD-10-CM

## 2012-07-30 DIAGNOSIS — F341 Dysthymic disorder: Secondary | ICD-10-CM

## 2012-07-30 NOTE — Progress Notes (Signed)
  Subjective:    Patient ID: [name] [name], male    DOB: 12-21-1960, 52 y.o.   MRN: [id number]  HPI He is here for consultation concerning multiple issues. Complains of a 3 year history of left-sided anterior to posterior chest pain. He has not made worse with breathing, movement, position. His had no shortness of breath, coughing or weight change. He also complains of left trapezius pain that is intermittent in nature and not related to the chest pain. There is no change with movement, lifting or position. This is been going on for roughly 2 months. He also has a six-month history of left elbow pain that is made worse with motion and also notes that lifting causes more difficulty. He has no history of overuse. He continues on his HIV meds. He also stopped his Klonopin. He seems to be fairly stable on his Effexor.   Review of Systems     Objective:   Physical Exam Joneen Boers and in no distress. Cardiac exam shows regular rhythm without murmurs gallops. Lungs clear to auscultation. No tenderness palpation of his chest or upper trapezius area. Full motion of the shoulder without pain. Exam of the left elbow does show tenderness palpation to the medial epicondyle and to a lesser extent the lateral epicondyle. Good motion of the elbow. No effusion noted.       Assessment & Plan:  Medial epicondylitis of elbow, left  Chest pain - Plan: DG Chest 2 View  HIV positive  Anxiety and depression explained the need for heat, anti-inflammatory. Also strongly encouraged him to change how he lifts things. Discussed possible referral to physical therapy.

## 2012-07-30 NOTE — Patient Instructions (Signed)
Heat to your elbow for 20 minutes 3 times per day. Take 2 Aleve twice per day for the next 10-14 days. Do as many things as you can palms up and open or palms down

## 2012-08-02 ENCOUNTER — Ambulatory Visit
Admission: RE | Admit: 2012-08-02 | Discharge: 2012-08-02 | Disposition: A | Discharge status: Home / Self Care | Payer: 59 | Source: Ambulatory Visit | Attending: Family Medicine | Admitting: Family Medicine

## 2012-08-02 DIAGNOSIS — R079 Chest pain, unspecified: Secondary | ICD-10-CM

## 2012-08-06 NOTE — Progress Notes (Signed)
Quick Note:  CALLED PT CELL # LEFT MESSAGE WORD FOR WORD   Let him know that the chest x-ray is negative. The fact that it has been going on this long speaks against anything bad. Tell him we can order a CT if he would like. AND TO CALL ME BACK AND LET ME KNOW   ______

## 2012-08-07 ENCOUNTER — Telehealth: Payer: Self-pay | Admitting: Family Medicine

## 2012-08-07 ENCOUNTER — Other Ambulatory Visit: Payer: Self-pay

## 2012-08-07 DIAGNOSIS — G8929 Other chronic pain: Secondary | ICD-10-CM

## 2012-08-07 NOTE — Telephone Encounter (Signed)
Go ahead and order the chest CT. The diagnosis will be chronic chest pain

## 2012-08-07 NOTE — Telephone Encounter (Signed)
Darragh WANTED THE CT SO PLEASE JUST LET ME KNOW THE DX CODE AND IF W/OR WITH OUT

## 2012-08-07 NOTE — Telephone Encounter (Signed)
[name] [name] returned your call and asked that you call him back

## 2012-08-20 ENCOUNTER — Other Ambulatory Visit: Payer: Self-pay | Admitting: Family Medicine

## 2012-08-24 ENCOUNTER — Other Ambulatory Visit: Payer: Self-pay | Admitting: Family Medicine

## 2012-08-24 ENCOUNTER — Telehealth: Payer: Self-pay | Admitting: Family Medicine

## 2012-08-24 ENCOUNTER — Telehealth: Payer: Self-pay | Admitting: Internal Medicine

## 2012-08-24 MED ORDER — VENLAFAXINE HCL 100 MG PO TABS: ORAL_TABLET | ORAL | Status: DC

## 2012-08-24 NOTE — Telephone Encounter (Signed)
done

## 2012-08-24 NOTE — Telephone Encounter (Signed)
Effects or renewed

## 2012-08-24 NOTE — Telephone Encounter (Signed)
Is this okay?

## 2012-08-28 ENCOUNTER — Ambulatory Visit (HOSPITAL_COMMUNITY)
Admission: RE | Admit: 2012-08-28 | Discharge: 2012-08-28 | Disposition: A | Discharge status: Home / Self Care | Payer: 59 | Source: Ambulatory Visit | Attending: Family Medicine | Admitting: Family Medicine

## 2012-08-28 DIAGNOSIS — R911 Solitary pulmonary nodule: Secondary | ICD-10-CM | POA: Insufficient documentation

## 2012-08-28 DIAGNOSIS — J479 Bronchiectasis, uncomplicated: Secondary | ICD-10-CM | POA: Insufficient documentation

## 2012-08-28 DIAGNOSIS — R079 Chest pain, unspecified: Secondary | ICD-10-CM | POA: Insufficient documentation

## 2012-08-28 NOTE — Progress Notes (Signed)
Quick Note:  Called pt to inform him just routine follow up needed pt verbalized understanding ______

## 2012-08-28 NOTE — Progress Notes (Signed)
Quick Note:  Patient was informed and verbalized understanding but wanted to know next step ______

## 2013-02-15 ENCOUNTER — Other Ambulatory Visit: Payer: Self-pay | Admitting: Family Medicine

## 2013-02-19 ENCOUNTER — Other Ambulatory Visit: Payer: Self-pay | Admitting: Family Medicine

## 2013-02-20 ENCOUNTER — Telehealth: Payer: Self-pay

## 2013-02-20 NOTE — Telephone Encounter (Signed)
LEFT MESSAGE ON PT CELL THAT DR.LALONDE SAID HE NEEDS FOLLOW UP PLEASE CALL AND MAKE THAT APOINTMENT

## 2013-03-20 ENCOUNTER — Encounter: Payer: Self-pay | Admitting: Family Medicine

## 2013-03-20 ENCOUNTER — Ambulatory Visit (INDEPENDENT_AMBULATORY_CARE_PROVIDER_SITE_OTHER): Payer: 59 | Admitting: Family Medicine

## 2013-03-20 VITALS — BP 118/88 | HR 78 | Ht 71.0 in | Wt 152.0 lb

## 2013-03-20 DIAGNOSIS — Z9889 Other specified postprocedural states: Secondary | ICD-10-CM

## 2013-03-20 DIAGNOSIS — F341 Dysthymic disorder: Secondary | ICD-10-CM

## 2013-03-20 DIAGNOSIS — Z21 Asymptomatic human immunodeficiency virus [HIV] infection status: Secondary | ICD-10-CM

## 2013-03-20 DIAGNOSIS — Z79899 Other long term (current) drug therapy: Secondary | ICD-10-CM

## 2013-03-20 DIAGNOSIS — F329 Major depressive disorder, single episode, unspecified: Secondary | ICD-10-CM

## 2013-03-20 DIAGNOSIS — F901 Attention-deficit hyperactivity disorder, predominantly hyperactive type: Secondary | ICD-10-CM

## 2013-03-20 DIAGNOSIS — F909 Attention-deficit hyperactivity disorder, unspecified type: Secondary | ICD-10-CM

## 2013-03-20 LAB — LIPID PANEL
Cholesterol: 196 mg/dL (ref 0–200)
HDL: 79 mg/dL (ref >39)
Total CHOL/HDL Ratio: 2.5 Ratio
Triglycerides: 190 mg/dL — ABNORMAL HIGH (ref <150)

## 2013-03-20 LAB — CBC WITH DIFFERENTIAL/PLATELET
Basophils Absolute: 0 10*3/uL (ref 0.0–0.1)
Eosinophils Relative: 2 % (ref 0–5)
HCT: 42.8 % (ref 39.0–52.0)
Lymphocytes Relative: 26 % (ref 12–46)
MCV: 95.7 fL (ref 78.0–100.0)
Monocytes Absolute: 0.7 10*3/uL (ref 0.1–1.0)
RDW: 13.7 % (ref 11.5–15.5)
WBC: 4.4 10*3/uL (ref 4.0–10.5)

## 2013-03-20 LAB — COMPREHENSIVE METABOLIC PANEL
AST: 60 U/L — ABNORMAL HIGH (ref 0–37)
BUN: 9 mg/dL (ref 6–23)
CO2: 25 mEq/L (ref 19–32)
Calcium: 9.2 mg/dL (ref 8.4–10.5)
Chloride: 110 mEq/L (ref 96–112)
Creat: 0.87 mg/dL (ref 0.50–1.35)

## 2013-03-20 MED ORDER — METHYLPHENIDATE HCL 10 MG PO TABS: 10.0000 mg | ORAL_TABLET | Freq: Two times a day (BID) | ORAL | Status: DC

## 2013-03-20 NOTE — Patient Instructions (Signed)
I need to know if it works, how long it works and if you have any trouble with it.

## 2013-03-20 NOTE — Progress Notes (Signed)
  Subjective:    Patient ID: [name] [name], male    DOB: 1960-07-23, 52 y.o.   MRN: [id number]  HPI He is here for recheck. He did have an anterior cruciate ligament repair in May of 2013. He seems to be doing fairly well with this. He continues on his HIV meds without difficulty as well as Effexor. He states that his mind still races. Further history indicates he has had difficulty his entire life. 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 He also has a brother that was diagnosed with ADD.   Review of Systems     Objective:   Physical Exam alert and in no distress. Fundi benign Tympanic membranes and canals are normal. Throat is clear. Tonsils are normal. Neck is supple without adenopathy or thyromegaly. Cardiac exam shows a regular sinus rhythm without murmurs or gallops. Lungs are clear to auscultation. Abdominal exam shows no hepatosplenomegaly, masses or tenderness. No axillary or inguinal adenopathy. ADHD questionnaire was 24 with definite evidence of hyperactivity       Assessment & Plan:  Anxiety and depression  HIV positive - Plan: CBC with Differential, Comprehensive metabolic panel, Lipid panel, HIV 1 RNA quant-no reflex-bld, T-helper cells (CD4) count  S/P ACL repair  Encounter for long-term (current) use of other medications - Plan: CBC with Differential, Comprehensive metabolic panel, Lipid panel, HIV 1 RNA quant-no reflex-bld, T-helper cells (CD4) count  ADHD (attention deficit hyperactivity disorder), predominantly hyperactive impulsive type - Plan: methylphenidate (RITALIN) 10 MG tablet  discussed the diagnosis of ADD with him. He definitely has qualities of hyperactivity. He also has an underlying anxiety/panic disorder in that when he gets in crowds he does become quite anxious. Discussed use of Ritalin in particular he will keep me informed as to whether it works, how long it works and if he has any  difficulties.

## 2013-03-21 LAB — T-HELPER CELLS (CD4) COUNT (NOT AT ARMC)
Absolute CD4: 206 /uL — ABNORMAL LOW (ref 381–1469)
CD4 T Helper %: 18 % — ABNORMAL LOW (ref 32–62)
Total lymphocyte count: 1144 /uL (ref 700–3300)

## 2013-03-25 ENCOUNTER — Telehealth: Payer: Self-pay | Admitting: Family Medicine

## 2013-03-25 NOTE — Telephone Encounter (Signed)
lm

## 2013-04-01 ENCOUNTER — Telehealth: Payer: Self-pay

## 2013-04-01 NOTE — Telephone Encounter (Signed)
Have him take 2 I need to know how long each one lasts. Then have him come in so we can discuss further medication management .

## 2013-04-01 NOTE — Telephone Encounter (Signed)
DR.LALONDE Kenn CALLED AND SAID MED. REALLY HELPS BUT WHEN IT WHERE'S OFF BACK TO FEELING LIKE HE COULD JUMP OUT OF SKIN HE HAS TRIED TAKING 2 A DAY AND NO PROBLEMS WITH SLEEP ASKED IF YOU COULD UP DOSAGE

## 2013-04-01 NOTE — Telephone Encounter (Signed)
LEFT WORD FOR WORD MESSAGE  

## 2013-04-19 ENCOUNTER — Other Ambulatory Visit: Payer: Self-pay | Admitting: Family Medicine

## 2013-04-19 ENCOUNTER — Encounter: Payer: Self-pay | Admitting: Family Medicine

## 2013-04-19 ENCOUNTER — Ambulatory Visit (INDEPENDENT_AMBULATORY_CARE_PROVIDER_SITE_OTHER): Payer: 59 | Admitting: Family Medicine

## 2013-04-19 VITALS — BP 118/70 | HR 83 | Wt 152.0 lb

## 2013-04-19 DIAGNOSIS — F909 Attention-deficit hyperactivity disorder, unspecified type: Secondary | ICD-10-CM

## 2013-04-19 DIAGNOSIS — F901 Attention-deficit hyperactivity disorder, predominantly hyperactive type: Secondary | ICD-10-CM

## 2013-04-19 MED ORDER — METHYLPHENIDATE HCL 10 MG PO TABS: 10.0000 mg | ORAL_TABLET | Freq: Two times a day (BID) | ORAL | Status: DC

## 2013-04-19 MED ORDER — METHYLPHENIDATE HCL ER (LA) 10 MG PO CP24: 10.0000 mg | ORAL_CAPSULE | Freq: Every day | ORAL | Status: DC

## 2013-04-19 NOTE — Progress Notes (Signed)
   Subjective:    Patient ID: [name] [name], male    DOB: 1960/12/15, 52 y.o.   MRN: [id number]  HPI He is here for recheck. He states that the Ritalin helps calm his mind and body down. He is able to finish tasks. He gets 4 to5 hours worth of benefit out of it. He takes the second one to get through the work day. He does work second shift. He notes no change in his sleep patterns.   Review of Systems     Objective:   Physical Exam Joneen Boers and in no distress otherwise not examined       Assessment & Plan:  ADHD (attention deficit hyperactivity disorder), predominantly hyperactive impulsive type - Plan: methylphenidate (RITALIN) 10 MG tablet, methylphenidate (RITALIN) 10 MG tablet, methylphenidate (RITALIN LA) 10 MG 24 hr capsule  I discussed the use of his medication on a regular basis versus as needed. He does tend to not use it on the weekend which I told it was fine however she had anything he needs to stay focused for it would be okay to use the medication.

## 2013-06-22 ENCOUNTER — Other Ambulatory Visit: Payer: Self-pay | Admitting: Family Medicine

## 2013-07-31 ENCOUNTER — Ambulatory Visit (INDEPENDENT_AMBULATORY_CARE_PROVIDER_SITE_OTHER): Payer: 59 | Admitting: Family Medicine

## 2013-07-31 ENCOUNTER — Encounter: Payer: Self-pay | Admitting: Family Medicine

## 2013-07-31 VITALS — BP 100/70 | HR 68 | Wt 158.0 lb

## 2013-07-31 DIAGNOSIS — F329 Major depressive disorder, single episode, unspecified: Secondary | ICD-10-CM

## 2013-07-31 DIAGNOSIS — F419 Anxiety disorder, unspecified: Secondary | ICD-10-CM

## 2013-07-31 DIAGNOSIS — Z21 Asymptomatic human immunodeficiency virus [HIV] infection status: Secondary | ICD-10-CM

## 2013-07-31 DIAGNOSIS — F909 Attention-deficit hyperactivity disorder, unspecified type: Secondary | ICD-10-CM

## 2013-07-31 DIAGNOSIS — F341 Dysthymic disorder: Secondary | ICD-10-CM

## 2013-07-31 DIAGNOSIS — F901 Attention-deficit hyperactivity disorder, predominantly hyperactive type: Secondary | ICD-10-CM | POA: Insufficient documentation

## 2013-07-31 MED ORDER — METHYLPHENIDATE HCL 10 MG PO TABS: 10.0000 mg | ORAL_TABLET | Freq: Two times a day (BID) | ORAL | Status: DC

## 2013-07-31 MED ORDER — SULFAMETHOXAZOLE-TMP DS 800-160 MG PO TABS: ORAL_TABLET | ORAL | Status: DC

## 2013-07-31 MED ORDER — METHYLPHENIDATE HCL ER (LA) 10 MG PO CP24: 10.0000 mg | ORAL_CAPSULE | Freq: Every day | ORAL | Status: DC

## 2013-07-31 MED ORDER — VENLAFAXINE HCL 100 MG PO TABS: ORAL_TABLET | ORAL | Status: DC

## 2013-07-31 NOTE — Progress Notes (Signed)
   Subjective:    Patient ID: [name] [name], male    DOB: 1961/01/23, 53 y.o.   MRN: [id number]  HPI He is here for medication check. He continues to do quite nicely on Ritalin. He is very happy with results. Sometimes he does not use his medications twice per day especially on weekends. He continues to do well on his Effexor and is very comfortable with that dosing. He continues on Atripla as well as Bactrim.  Review of Systems     Objective:   Physical Exam Alert and in no distress otherwise not examined      Assessment & Plan:  ADHD (attention deficit hyperactivity disorder), predominantly hyperactive impulsive type - Plan: methylphenidate (RITALIN LA) 10 MG 24 hr capsule, methylphenidate (RITALIN) 10 MG tablet, methylphenidate (RITALIN) 10 MG tablet  HIV positive - Plan: sulfamethoxazole-trimethoprim (BACTRIM DS) 800-160 MG per tablet  Anxiety and depression - Plan: venlafaxine (EFFEXOR) 100 MG tablet  continue on his Ritalin at the present dosing. He is to return here in roughly 2 months for recheck on his HIV status. His last blood work did show a CD4 count of right around 200.

## 2013-08-14 ENCOUNTER — Other Ambulatory Visit: Payer: Self-pay | Admitting: Family Medicine

## 2013-09-17 ENCOUNTER — Encounter: Payer: Self-pay | Admitting: Family Medicine

## 2013-09-17 ENCOUNTER — Ambulatory Visit (INDEPENDENT_AMBULATORY_CARE_PROVIDER_SITE_OTHER): Payer: 59 | Admitting: Family Medicine

## 2013-09-17 VITALS — BP 110/90 | HR 80 | Wt 156.0 lb

## 2013-09-17 DIAGNOSIS — F419 Anxiety disorder, unspecified: Secondary | ICD-10-CM

## 2013-09-17 DIAGNOSIS — F329 Major depressive disorder, single episode, unspecified: Secondary | ICD-10-CM

## 2013-09-17 DIAGNOSIS — F909 Attention-deficit hyperactivity disorder, unspecified type: Secondary | ICD-10-CM

## 2013-09-17 DIAGNOSIS — F901 Attention-deficit hyperactivity disorder, predominantly hyperactive type: Secondary | ICD-10-CM

## 2013-09-17 DIAGNOSIS — F341 Dysthymic disorder: Secondary | ICD-10-CM

## 2013-09-17 DIAGNOSIS — Z21 Asymptomatic human immunodeficiency virus [HIV] infection status: Secondary | ICD-10-CM

## 2013-09-17 DIAGNOSIS — Z9889 Other specified postprocedural states: Secondary | ICD-10-CM

## 2013-09-17 DIAGNOSIS — Z79899 Other long term (current) drug therapy: Secondary | ICD-10-CM

## 2013-09-17 LAB — CBC WITH DIFFERENTIAL/PLATELET
BASOS ABS: 0 10*3/uL (ref 0.0–0.1)
Basophils Relative: 1 % (ref 0–1)
Eosinophils Absolute: 0.1 10*3/uL (ref 0.0–0.7)
Eosinophils Relative: 4 % (ref 0–5)
HCT: 44.9 % (ref 39.0–52.0)
Hemoglobin: 15.7 g/dL (ref 13.0–17.0)
LYMPHS ABS: 1 10*3/uL (ref 0.7–4.0)
Lymphocytes Relative: 30 % (ref 12–46)
MCH: 33.3 pg (ref 26.0–34.0)
MCHC: 35 g/dL (ref 30.0–36.0)
MCV: 95.1 fL (ref 78.0–100.0)
MONO ABS: 0.5 10*3/uL (ref 0.1–1.0)
Monocytes Relative: 16 % — ABNORMAL HIGH (ref 3–12)
NEUTROS ABS: 1.6 10*3/uL — AB (ref 1.7–7.7)
Neutrophils Relative %: 49 % (ref 43–77)
Platelets: 254 10*3/uL (ref 150–400)
RBC: 4.72 MIL/uL (ref 4.22–5.81)
RDW: 13.3 % (ref 11.5–15.5)
WBC: 3.2 10*3/uL — AB (ref 4.0–10.5)

## 2013-09-17 LAB — LIPID PANEL
CHOLESTEROL: 210 mg/dL — AB (ref 0–200)
HDL: 68 mg/dL (ref >39)
LDL Cholesterol: 95 mg/dL (ref 0–99)
Total CHOL/HDL Ratio: 3.1 Ratio
Triglycerides: 234 mg/dL — ABNORMAL HIGH (ref <150)
VLDL: 47 mg/dL — ABNORMAL HIGH (ref 0–40)

## 2013-09-17 LAB — COMPREHENSIVE METABOLIC PANEL
ALBUMIN: 4.2 g/dL (ref 3.5–5.2)
ALT: 22 U/L (ref 0–53)
AST: 26 U/L (ref 0–37)
Alkaline Phosphatase: 80 U/L (ref 39–117)
BUN: 9 mg/dL (ref 6–23)
CALCIUM: 9.4 mg/dL (ref 8.4–10.5)
CHLORIDE: 103 meq/L (ref 96–112)
CO2: 26 meq/L (ref 19–32)
Creat: 0.98 mg/dL (ref 0.50–1.35)
Glucose, Bld: 87 mg/dL (ref 70–99)
POTASSIUM: 4.3 meq/L (ref 3.5–5.3)
Sodium: 137 mEq/L (ref 135–145)
Total Bilirubin: 0.5 mg/dL (ref 0.2–1.2)
Total Protein: 7 g/dL (ref 6.0–8.3)

## 2013-09-17 NOTE — Progress Notes (Signed)
   Subjective:    Patient ID: [name] [name], male    DOB: [birth year], 53 y.o.   MRN: [id number]  HPI He is here for an interval evaluation. He has no particular concerns or complaints. He continues on medications listed in the chart. His ADD medication continues to work quite well. He has not really had any difficulty with anxiety and depression. His knee is giving him no trouble. His had no fever, chills, weight change, nausea vomiting, headache.  Review of Systems     Objective:   Physical Exam alert and in no distress. Tympanic membranes and canals are normal. Throat is clear. Tonsils are normal. Neck is supple without adenopathy or thyromegaly. Cardiac exam shows a regular sinus rhythm without murmurs or gallops. Lungs are clear to auscultation. Abdominal exam shows no masses or tenderness. No axillary or inguinal adenopathy noted.       Assessment & Plan:  HIV positive - Plan: CBC with Differential, Comprehensive metabolic panel, HIV 1 RNA quant-no reflex-bld, T-helper cells (CD4) count  ADHD (attention deficit hyperactivity disorder), predominantly hyperactive impulsive type  Anxiety and depression  Encounter for long-term (current) use of other medications - Plan: CBC with Differential, Comprehensive metabolic panel, Lipid panel, HIV 1 RNA quant-no reflex-bld, T-helper cells (CD4) count  S/P ACL repair  continue present medication regimen.

## 2013-09-18 ENCOUNTER — Encounter: Payer: Self-pay | Admitting: Family Medicine

## 2013-09-18 LAB — T-HELPER CELLS (CD4) COUNT (NOT AT ARMC)
Absolute CD4: 240 /uL — ABNORMAL LOW (ref 381–1469)
CD4 T Helper %: 25 % — ABNORMAL LOW (ref 32–62)
TOTAL LYMPHOCYTE COUNT: 960 /uL (ref 700–3300)
TOTAL LYMPHOCYTE: 30 % (ref 12–46)
WBC, LYMPH ENUMERATION: 3.2 10*3/uL — AB (ref 4.0–10.5)

## 2013-09-18 LAB — HIV-1 RNA QUANT-NO REFLEX-BLD: HIV-1 RNA Quant, Log: <1.3 {Log} (ref <1.30)

## 2013-09-26 ENCOUNTER — Ambulatory Visit: Payer: 59 | Admitting: Family Medicine

## 2013-09-30 ENCOUNTER — Telehealth: Payer: Self-pay | Admitting: Family Medicine

## 2013-10-01 MED ORDER — EFAVIRENZ-EMTRICITAB-TENOFOVIR 600-200-300 MG PO TABS: ORAL_TABLET | ORAL | Status: DC

## 2013-10-01 NOTE — Telephone Encounter (Signed)
Atripla renewed

## 2013-10-03 ENCOUNTER — Telehealth: Payer: Self-pay | Admitting: Family Medicine

## 2013-10-03 MED ORDER — EFAVIRENZ-EMTRICITAB-TENOFOVIR 600-200-300 MG PO TABS: ORAL_TABLET | ORAL | Status: DC

## 2013-10-03 NOTE — Telephone Encounter (Signed)
done

## 2014-01-29 ENCOUNTER — Telehealth: Payer: Self-pay | Admitting: Family Medicine

## 2014-01-29 DIAGNOSIS — F901 Attention-deficit hyperactivity disorder, predominantly hyperactive type: Secondary | ICD-10-CM

## 2014-01-29 NOTE — Telephone Encounter (Signed)
Pt called requesting refills on ritalin. Please call [phone] when ready.

## 2014-01-30 MED ORDER — METHYLPHENIDATE HCL ER (LA) 10 MG PO CP24: 10.0000 mg | ORAL_CAPSULE | Freq: Every day | ORAL | Status: DC

## 2014-05-21 ENCOUNTER — Telehealth: Payer: Self-pay | Admitting: Internal Medicine

## 2014-05-21 NOTE — Telephone Encounter (Signed)
Needs an appt

## 2014-05-21 NOTE — Telephone Encounter (Signed)
Refill request for sulfamethoxazole-tmp DS tablet to [facility]

## 2014-05-22 ENCOUNTER — Telehealth: Payer: Self-pay | Admitting: Family Medicine

## 2014-05-22 NOTE — Telephone Encounter (Signed)
LM to CB for Appointment

## 2014-05-27 ENCOUNTER — Encounter: Payer: Self-pay | Admitting: Family Medicine

## 2014-05-27 ENCOUNTER — Ambulatory Visit (INDEPENDENT_AMBULATORY_CARE_PROVIDER_SITE_OTHER): Payer: 59 | Admitting: Family Medicine

## 2014-05-27 VITALS — BP 120/70 | HR 115 | Wt 150.0 lb

## 2014-05-27 DIAGNOSIS — Z23 Encounter for immunization: Secondary | ICD-10-CM

## 2014-05-27 DIAGNOSIS — Z79899 Other long term (current) drug therapy: Secondary | ICD-10-CM

## 2014-05-27 DIAGNOSIS — F901 Attention-deficit hyperactivity disorder, predominantly hyperactive type: Secondary | ICD-10-CM

## 2014-05-27 DIAGNOSIS — Z21 Asymptomatic human immunodeficiency virus [HIV] infection status: Secondary | ICD-10-CM

## 2014-05-27 LAB — CBC WITH DIFFERENTIAL/PLATELET
BASOS PCT: 1 % (ref 0–1)
Basophils Absolute: 0 10*3/uL (ref 0.0–0.1)
EOS ABS: 0.1 10*3/uL (ref 0.0–0.7)
Eosinophils Relative: 2 % (ref 0–5)
HCT: 42.1 % (ref 39.0–52.0)
Hemoglobin: 14.6 g/dL (ref 13.0–17.0)
Lymphocytes Relative: 24 % (ref 12–46)
Lymphs Abs: 1 10*3/uL (ref 0.7–4.0)
MCH: 34 pg (ref 26.0–34.0)
MCHC: 34.7 g/dL (ref 30.0–36.0)
MCV: 97.9 fL (ref 78.0–100.0)
MONOS PCT: 21 % — AB (ref 3–12)
MPV: 9.5 fL (ref 8.6–12.4)
Monocytes Absolute: 0.9 10*3/uL (ref 0.1–1.0)
Neutro Abs: 2.2 10*3/uL (ref 1.7–7.7)
Neutrophils Relative %: 52 % (ref 43–77)
Platelets: 250 10*3/uL (ref 150–400)
RBC: 4.3 MIL/uL (ref 4.22–5.81)
RDW: 13.2 % (ref 11.5–15.5)
WBC: 4.2 10*3/uL (ref 4.0–10.5)

## 2014-05-27 MED ORDER — METHYLPHENIDATE HCL ER (LA) 10 MG PO CP24: 10.0000 mg | ORAL_CAPSULE | Freq: Every day | ORAL | Status: DC

## 2014-05-27 MED ORDER — SULFAMETHOXAZOLE-TRIMETHOPRIM 800-160 MG PO TABS: 1.0000 | ORAL_TABLET | Freq: Every day | ORAL | Status: DC

## 2014-05-27 NOTE — Progress Notes (Signed)
   Subjective:    Patient ID: [name] [name], male    DOB: May 27, 1960, 54 y.o.   MRN: [id number]  HPI He is here for medication check. He continues on his HIV medications and is having no difficulty with them. He had no fever chills, sore throat, weight change, headaches. He needs refills on some of these medications. He also has underlying ADHD and is taking Ritalin. He is potentially going to be losing his job and is now starting to study for a different career and will therefore need more medication.   Review of Systems     Objective:   Physical Exam alert and in no distress. Tympanic membranes and canals are normal. Throat is clear. Tonsils are normal. Neck is supple without adenopathy or thyromegaly. Cardiac exam shows a regular sinus rhythm without murmurs or gallops. Lungs are clear to auscultation. No axillary or inguinal adenopathy. Abdominal exam shows no hepatosplenomegaly, masses or tenderness.        Assessment & Plan:  Need for prophylactic vaccination against Streptococcus pneumoniae (pneumococcus) - Plan: Pneumococcal conjugate vaccine 13-valent  ADHD (attention deficit hyperactivity disorder), predominantly hyperactive impulsive type - Plan: methylphenidate (RITALIN LA) 10 MG 24 hr capsule, methylphenidate (RITALIN LA) 10 MG 24 hr capsule, methylphenidate (RITALIN LA) 10 MG 24 hr capsule  HIV positive - Plan: CBC with Differential, Comprehensive metabolic panel, Lipid panel, HIV 1 RNA quant-no reflex-bld, T-helper cells (CD4) count, sulfamethoxazole-trimethoprim (BACTRIM DS,SEPTRA DS) 800-160 MG per tablet  Encounter for long-term (current) use of medications - Plan: CBC with Differential, Comprehensive metabolic panel, Lipid panel, HIV 1 RNA quant-no reflex-bld, T-helper cells (CD4) count

## 2014-05-28 LAB — COMPREHENSIVE METABOLIC PANEL
ALK PHOS: 107 U/L (ref 39–117)
ALT: 50 U/L (ref 0–53)
AST: 46 U/L — AB (ref 0–37)
Albumin: 4.1 g/dL (ref 3.5–5.2)
BUN: 10 mg/dL (ref 6–23)
CALCIUM: 9.3 mg/dL (ref 8.4–10.5)
CO2: 23 mEq/L (ref 19–32)
Chloride: 104 mEq/L (ref 96–112)
Creat: 0.89 mg/dL (ref 0.50–1.35)
Glucose, Bld: 93 mg/dL (ref 70–99)
POTASSIUM: 4.2 meq/L (ref 3.5–5.3)
SODIUM: 137 meq/L (ref 135–145)
Total Bilirubin: 0.6 mg/dL (ref 0.2–1.2)
Total Protein: 6.9 g/dL (ref 6.0–8.3)

## 2014-05-28 LAB — LIPID PANEL
Cholesterol: 226 mg/dL — ABNORMAL HIGH (ref 0–200)
HDL: 87 mg/dL (ref >39)
LDL CALC: 103 mg/dL — AB (ref 0–99)
TRIGLYCERIDES: 178 mg/dL — AB (ref <150)
Total CHOL/HDL Ratio: 2.6 Ratio
VLDL: 36 mg/dL (ref 0–40)

## 2014-05-28 LAB — T-HELPER CELLS (CD4) COUNT (NOT AT ARMC)
ABSOLUTE CD4: 212 /uL — AB (ref 381–1469)
CD4 T HELPER %: 21 % — AB (ref 32–62)
TOTAL LYMPHOCYTE COUNT: 1008 /uL (ref 700–3300)
Total Lymphocyte: 24 % (ref 12–46)
WBC, lymph enumeration: 4.2 10*3/uL (ref 4.0–10.5)

## 2014-05-29 LAB — HIV-1 RNA QUANT-NO REFLEX-BLD

## 2014-08-04 ENCOUNTER — Other Ambulatory Visit: Payer: Self-pay | Admitting: Family Medicine

## 2014-08-04 NOTE — Telephone Encounter (Signed)
Is this okay?

## 2014-09-15 ENCOUNTER — Encounter: Payer: Self-pay | Admitting: Family Medicine

## 2014-09-15 ENCOUNTER — Ambulatory Visit (INDEPENDENT_AMBULATORY_CARE_PROVIDER_SITE_OTHER): Payer: 59 | Admitting: Family Medicine

## 2014-09-15 VITALS — BP 130/98 | HR 104 | Wt 154.2 lb

## 2014-09-15 DIAGNOSIS — G5622 Lesion of ulnar nerve, left upper limb: Secondary | ICD-10-CM

## 2014-09-15 NOTE — Progress Notes (Signed)
   Subjective:    Patient ID: [name] [name], male    DOB: 1961-01-14, 54 y.o.   MRN: [id number]  HPI He complains of a four-month history of difficulty with a numb sensation in his third fourth and fifth fingers on the left. This bothers him especially at night. He notes nothing that makes this better or worse. He has had no neck pain. He's had no weakness or dropping things. He also complains of a tingling sensation in his left foot has been going on for a much shorter period of time. He is unsure the exact amount of time.   Review of Systems     Objective:   Physical Exam Alert and in no distress. Normal motion of his neck. Shoulder motion without difficulty. Decreased sensation of the third fourth and fifth fingers with normal motor function. Bending of the elbow did not increase the symptoms. Compression over the ulnar notch again had no symptoms. Exam of his left foot showed normal pulses sensation and reflexes.       Assessment & Plan:  Ulnar neuropathy of left upper extremity - Plan: Motor nerve conduction test w/ f-wave study I explained that the foot at this time is not causing much difficulty and watchful waiting would be appropriate. We will follow-up pending results of the nerve conduction study.

## 2014-09-17 ENCOUNTER — Telehealth: Payer: Self-pay | Admitting: Family Medicine

## 2014-09-17 NOTE — Telephone Encounter (Signed)
Pt said we asked him to us know [facility] and he has not heard anything regarding setting up an appt yet

## 2014-09-18 NOTE — Telephone Encounter (Signed)
Fax came over stating pt was aware of appt to [facility] and also appt states they spoke to pt

## 2014-10-01 ENCOUNTER — Ambulatory Visit (INDEPENDENT_AMBULATORY_CARE_PROVIDER_SITE_OTHER): Payer: Self-pay | Admitting: Neurology

## 2014-10-01 ENCOUNTER — Encounter: Payer: Self-pay | Admitting: Neurology

## 2014-10-01 ENCOUNTER — Ambulatory Visit (INDEPENDENT_AMBULATORY_CARE_PROVIDER_SITE_OTHER): Payer: 59 | Admitting: Neurology

## 2014-10-01 DIAGNOSIS — G5622 Lesion of ulnar nerve, left upper limb: Secondary | ICD-10-CM

## 2014-10-01 HISTORY — DX: Lesion of ulnar nerve, left upper limb: G56.22

## 2014-10-01 NOTE — Procedures (Signed)
     HISTORY:  [name] [name] is a 54 year old gentleman with a history of numbness in the third, fourth, and fifth fingers on the left hand over the last 4 months. The patient denies any weakness of the hand. He denies any pain in the neck or pain down the left arm. He is being evaluated for possible neuropathy or a cervical radiculopathy.  NERVE CONDUCTION STUDIES:  Nerve conduction studies were performed on both upper extremities. The distal motor latencies and motor amplitudes for the median nerves bilaterally and for the right ulnar nerve were normal. The F wave latencies and nerve conduction velocities for these nerves were normal. The study of the left ulnar nerve revealed a normal distal motor latency and a normal motor amplitude. The F wave latency was borderline normal, but there was slowing above and below the elbow. The sensory latencies for the median nerves and for the right on her nerve were normal, there was prolongation of the left ulnar sensory latency.  EMG STUDIES:  EMG study was performed on the left upper extremity:  The first dorsal interosseous muscle reveals 2 to 4 K units with full recruitment. No fibrillations or positive waves were noted. The abductor pollicis brevis muscle reveals 2 to 4 K units with full recruitment. No fibrillations or positive waves were noted. The extensor indicis proprius muscle reveals 1 to 3 K units with full recruitment. No fibrillations or positive waves were noted. The pronator teres muscle reveals 2 to 3 K units with full recruitment. No fibrillations or positive waves were noted. The flexor digitorum profundus muscle (III-IV) reveals 1 to 3 K units with full recruitment. No fibrillations or positive waves were seen. The biceps muscle reveals 1 to 2 K units with full recruitment. No fibrillations or positive waves were noted. The triceps muscle reveals 2 to 4 K units with full recruitment. No fibrillations or positive waves were  noted. The anterior deltoid muscle reveals 2 to 3 K units with full recruitment. No fibrillations or positive waves were noted. The cervical paraspinal muscles were tested at 2 levels. No abnormalities of insertional activity were seen at either level tested, with exception that complex repetitive discharges were seen at the lower level. There was good relaxation.   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 EMG evaluation of left upper extremity was relatively unremarkable, without clear evidence of an overlying cervical radiculopathy.  [name] [name]. [name] Willis MD 10/01/2014 1:43 PM  [facility] [address] [place] [id number]  Phone [phone] Fax [phone]

## 2014-10-01 NOTE — Progress Notes (Signed)
Please refer to EMG and nerve conduction study procedure note. 

## 2014-10-20 ENCOUNTER — Other Ambulatory Visit: Payer: Self-pay | Admitting: Family Medicine

## 2014-12-08 ENCOUNTER — Telehealth: Payer: Self-pay | Admitting: Family Medicine

## 2014-12-08 NOTE — Telephone Encounter (Signed)
Pt is requesting generic Ritalin as it is cheaper.

## 2014-12-08 NOTE — Telephone Encounter (Signed)
It has been 6 months. Have him set up an appointment to discuss the HIV and Ritalin

## 2014-12-09 NOTE — Telephone Encounter (Signed)
Left word for word message on pt cell # 

## 2014-12-09 NOTE — Telephone Encounter (Signed)
Advised pt he needs appt before he has a refill,  He can't make appt until he gets his class schedule & he will call back

## 2014-12-16 ENCOUNTER — Other Ambulatory Visit: Payer: Self-pay | Admitting: Family Medicine

## 2014-12-16 ENCOUNTER — Encounter: Payer: Self-pay | Admitting: Family Medicine

## 2014-12-16 ENCOUNTER — Ambulatory Visit (INDEPENDENT_AMBULATORY_CARE_PROVIDER_SITE_OTHER): Payer: 59 | Admitting: Family Medicine

## 2014-12-16 VITALS — BP 130/80 | HR 102 | Wt 158.0 lb

## 2014-12-16 DIAGNOSIS — Z21 Asymptomatic human immunodeficiency virus [HIV] infection status: Secondary | ICD-10-CM | POA: Diagnosis not present

## 2014-12-16 DIAGNOSIS — Z79899 Other long term (current) drug therapy: Secondary | ICD-10-CM | POA: Diagnosis not present

## 2014-12-16 DIAGNOSIS — F17200 Nicotine dependence, unspecified, uncomplicated: Secondary | ICD-10-CM | POA: Insufficient documentation

## 2014-12-16 DIAGNOSIS — Z720 Tobacco use: Secondary | ICD-10-CM

## 2014-12-16 DIAGNOSIS — K219 Gastro-esophageal reflux disease without esophagitis: Secondary | ICD-10-CM

## 2014-12-16 DIAGNOSIS — F901 Attention-deficit hyperactivity disorder, predominantly hyperactive type: Secondary | ICD-10-CM | POA: Diagnosis not present

## 2014-12-16 DIAGNOSIS — F419 Anxiety disorder, unspecified: Secondary | ICD-10-CM

## 2014-12-16 DIAGNOSIS — F329 Major depressive disorder, single episode, unspecified: Secondary | ICD-10-CM

## 2014-12-16 DIAGNOSIS — F418 Other specified anxiety disorders: Secondary | ICD-10-CM | POA: Diagnosis not present

## 2014-12-16 DIAGNOSIS — F32A Depression, unspecified: Secondary | ICD-10-CM

## 2014-12-16 LAB — CBC WITH DIFFERENTIAL/PLATELET
Basophils Absolute: 0 10*3/uL (ref 0.0–0.1)
Basophils Relative: 0 % (ref 0–1)
Eosinophils Absolute: 0.1 10*3/uL (ref 0.0–0.7)
Eosinophils Relative: 2 % (ref 0–5)
HCT: 42 % (ref 39.0–52.0)
Hemoglobin: 14.8 g/dL (ref 13.0–17.0)
Lymphocytes Relative: 29 % (ref 12–46)
Lymphs Abs: 1.5 10*3/uL (ref 0.7–4.0)
MCH: 33.8 pg (ref 26.0–34.0)
MCHC: 35.2 g/dL (ref 30.0–36.0)
MCV: 95.9 fL (ref 78.0–100.0)
MPV: 8.7 fL (ref 8.6–12.4)
Monocytes Absolute: 0.7 10*3/uL (ref 0.1–1.0)
Monocytes Relative: 13 % — ABNORMAL HIGH (ref 3–12)
Neutro Abs: 3 10*3/uL (ref 1.7–7.7)
Neutrophils Relative %: 56 % (ref 43–77)
Platelets: 276 10*3/uL (ref 150–400)
RBC: 4.38 MIL/uL (ref 4.22–5.81)
RDW: 12.9 % (ref 11.5–15.5)
WBC: 5.3 10*3/uL (ref 4.0–10.5)

## 2014-12-16 MED ORDER — SULFAMETHOXAZOLE-TRIMETHOPRIM 800-160 MG PO TABS: 1.0000 | ORAL_TABLET | Freq: Every day | ORAL | Status: DC

## 2014-12-16 MED ORDER — VENLAFAXINE HCL 100 MG PO TABS: ORAL_TABLET | ORAL | Status: DC

## 2014-12-16 MED ORDER — EFAVIRENZ-EMTRICITAB-TENOFOVIR 600-200-300 MG PO TABS: 1.0000 | ORAL_TABLET | Freq: Every day | ORAL | Status: DC

## 2014-12-16 MED ORDER — METHYLPHENIDATE HCL 10 MG PO TABS: 10.0000 mg | ORAL_TABLET | Freq: Two times a day (BID) | ORAL | Status: AC

## 2014-12-16 MED ORDER — METHYLPHENIDATE HCL 10 MG PO TABS: 10.0000 mg | ORAL_TABLET | Freq: Two times a day (BID) | ORAL | Status: DC

## 2014-12-16 MED ORDER — OMEPRAZOLE 20 MG PO CPDR: 20.0000 mg | DELAYED_RELEASE_CAPSULE | Freq: Every day | ORAL | Status: DC

## 2014-12-16 NOTE — Patient Instructions (Addendum)
Call 800 quit now to get set up to help work on the habit of smoking. Once you do this and we can talk about possibly using medication.

## 2014-12-16 NOTE — Progress Notes (Signed)
   Subjective:    Patient ID: [name] [name], male    DOB: 18-Oct-1960, 54 y.o.   MRN: [id number]  HPI He is here for medication management. He would like a colonoscopy since he is now over 50. He continues on his HIV medications. He is having no fever, chills, headache, sore throat or other difficulty. He is in the process of getting job training and would like to start back on Ritalin but on the generic which will cost him less. He states the 10 mg usually lasts for hours. His reflux is under good control with Prilosec. He continues on Effexor and is doing well with his anxiety. He would like to quit smoking. He has tried patches in the past and has been using them intermittently. He has not put any effort her energy into making habit changes.   Review of Systems     Objective:   Physical Exam Alert and in no distress. Tympanic membranes and canals are normal. Pharyngeal area is normal. Neck is supple without adenopathy or thyromegaly. Cardiac exam shows a regular sinus rhythm without murmurs or gallops. Lungs are clear to auscultation. Abdominal exam shows no masses or tenderness with normal bowel sounds.        Assessment & Plan:  HIV positive - Plan: CBC with Differential/Platelet, Comprehensive metabolic panel, Lipid panel, HIV 1 RNA quant-no reflex-bld, T-helper cells (CD4) count, sulfamethoxazole-trimethoprim (BACTRIM DS,SEPTRA DS) 800-160 MG per tablet, efavirenz-emtricitabine-tenofovir (ATRIPLA) 600-200-300 MG per tablet  ADHD (attention deficit hyperactivity disorder), predominantly hyperactive impulsive type - Plan: methylphenidate (RITALIN) 10 MG tablet, methylphenidate (RITALIN) 10 MG tablet, methylphenidate (RITALIN) 10 MG tablet  Smoker  Encounter for long-term (current) use of other high-risk medications - Plan: CBC with Differential/Platelet, Comprehensive metabolic panel, Lipid panel, HIV 1 RNA quant-no reflex-bld, T-helper cells (CD4) count  Anxiety and depression -  Plan: venlafaxine (EFFEXOR) 100 MG tablet  Gastroesophageal reflux disease without esophagitis - Plan: omeprazole (PRILOSEC) 20 MG capsule  his medications were renewed. He was quite hyper today. I encouraged him to look at his smoking habits and dealing with them more effectively. Explained that his symptoms sound more like truly habit than addiction. Recommend he call the 800 quit now number. May consider placing him on a different medication later but not at this time.

## 2014-12-17 LAB — COMPREHENSIVE METABOLIC PANEL
ALT: 71 U/L — ABNORMAL HIGH (ref 9–46)
AST: 87 U/L — ABNORMAL HIGH (ref 10–35)
Albumin: 3.8 g/dL (ref 3.6–5.1)
Alkaline Phosphatase: 105 U/L (ref 40–115)
BILIRUBIN TOTAL: 0.4 mg/dL (ref 0.2–1.2)
BUN: 5 mg/dL — ABNORMAL LOW (ref 7–25)
CALCIUM: 8.7 mg/dL (ref 8.6–10.3)
CO2: 20 mmol/L (ref 20–31)
Chloride: 97 mmol/L — ABNORMAL LOW (ref 98–110)
Creat: 0.78 mg/dL (ref 0.70–1.33)
Glucose, Bld: 84 mg/dL (ref 65–99)
Potassium: 4.1 mmol/L (ref 3.5–5.3)
SODIUM: 128 mmol/L — AB (ref 135–146)
TOTAL PROTEIN: 6.8 g/dL (ref 6.1–8.1)

## 2014-12-17 LAB — T-HELPER CELLS (CD4) COUNT (NOT AT ARMC)
Absolute CD4: 292 /uL — ABNORMAL LOW (ref 381–1469)
CD4 T HELPER %: 19 % — AB (ref 32–62)
Total Lymphocyte: 29 % (ref 12–46)
Total lymphocyte count: 1537 /uL (ref 700–3300)
WBC, lymph enumeration: 5.3 10*3/uL (ref 4.0–10.5)

## 2014-12-17 LAB — HIV-1 RNA QUANT-NO REFLEX-BLD: HIV-1 RNA Quant, Log: <1.3 {Log} (ref <1.30)

## 2014-12-17 LAB — LIPID PANEL
CHOL/HDL RATIO: 2.9 ratio (ref <=5.0)
CHOLESTEROL: 235 mg/dL — AB (ref 125–200)
HDL: 80 mg/dL (ref >=40)
LDL Cholesterol: 108 mg/dL (ref <130)
Triglycerides: 234 mg/dL — ABNORMAL HIGH (ref <150)
VLDL: 47 mg/dL — ABNORMAL HIGH (ref <30)

## 2014-12-19 ENCOUNTER — Other Ambulatory Visit: Payer: Self-pay

## 2014-12-19 DIAGNOSIS — E871 Hypo-osmolality and hyponatremia: Secondary | ICD-10-CM

## 2014-12-19 LAB — HEPATITIS C ANTIBODY: HCV AB: NEGATIVE

## 2014-12-23 ENCOUNTER — Ambulatory Visit (INDEPENDENT_AMBULATORY_CARE_PROVIDER_SITE_OTHER): Payer: 59 | Admitting: Family Medicine

## 2014-12-23 ENCOUNTER — Other Ambulatory Visit: Payer: Self-pay | Admitting: Family Medicine

## 2014-12-23 ENCOUNTER — Other Ambulatory Visit: Payer: Self-pay

## 2014-12-23 ENCOUNTER — Ambulatory Visit
Admission: RE | Admit: 2014-12-23 | Discharge: 2014-12-23 | Disposition: A | Discharge status: Home / Self Care | Payer: 59 | Source: Ambulatory Visit | Attending: Family Medicine | Admitting: Family Medicine

## 2014-12-23 ENCOUNTER — Encounter: Payer: Self-pay | Admitting: Family Medicine

## 2014-12-23 VITALS — BP 160/90 | HR 111 | Wt 155.0 lb

## 2014-12-23 DIAGNOSIS — R109 Unspecified abdominal pain: Secondary | ICD-10-CM

## 2014-12-23 DIAGNOSIS — E871 Hypo-osmolality and hyponatremia: Secondary | ICD-10-CM | POA: Diagnosis not present

## 2014-12-23 LAB — COMPREHENSIVE METABOLIC PANEL
ALBUMIN: 4 g/dL (ref 3.6–5.1)
ALT: 79 U/L — ABNORMAL HIGH (ref 9–46)
AST: 113 U/L — ABNORMAL HIGH (ref 10–35)
Alkaline Phosphatase: 138 U/L — ABNORMAL HIGH (ref 40–115)
BILIRUBIN TOTAL: 0.7 mg/dL (ref 0.2–1.2)
BUN: 5 mg/dL — AB (ref 7–25)
CALCIUM: 9.1 mg/dL (ref 8.6–10.3)
CO2: 23 mmol/L (ref 20–31)
Chloride: 100 mmol/L (ref 98–110)
Creat: 0.91 mg/dL (ref 0.70–1.33)
Glucose, Bld: 100 mg/dL — ABNORMAL HIGH (ref 65–99)
Potassium: 4.3 mmol/L (ref 3.5–5.3)
Sodium: 135 mmol/L (ref 135–146)
Total Protein: 7.1 g/dL (ref 6.1–8.1)

## 2014-12-23 MED ORDER — TRAMADOL HCL 50 MG PO TABS: 50.0000 mg | ORAL_TABLET | Freq: Three times a day (TID) | ORAL | Status: DC | PRN

## 2014-12-23 NOTE — Progress Notes (Signed)
   Subjective:    Patient ID: [name] [name], male    DOB: 09/05/1960, 54 y.o.   MRN: [id number]  HPI He complains of a 5 day history of left CVA pain. The pain is made worse with motion and coughing. He's had no fever, chills, cough, congestion, nausea, vomiting, change in bowel habits. He does smoke. He continues on 2 Aleve twice per day that he is using for other aches and pains. He continues on his HIV medicines. He recently had blood work which did show a low sodium and potassium and has been on extra salt.   Review of Systems     Objective:   Physical Exam Alert and in no distress. Abdominal exam shows active bowel sounds without masses or tenderness. No CVA tenderness is noted. Pain on motion of the back and also with deep breathing.       Assessment & Plan:  Left flank pain - Plan: DG Chest 2 View, DG THORACOLUMABAR SPINE, traMADol (ULTRAM) 50 MG tablet  Low sodium levels - Plan: Comprehensive metabolic panel, Basic Metabolic Panel

## 2014-12-24 LAB — LIPASE: Lipase: 50 U/L (ref 7–60)

## 2014-12-24 LAB — AMYLASE: Amylase: 48 U/L (ref 0–105)

## 2014-12-29 ENCOUNTER — Telehealth: Payer: Self-pay

## 2014-12-29 NOTE — Telephone Encounter (Signed)
Let him know that as long as he improves, I will leave him alone.

## 2014-12-29 NOTE — Telephone Encounter (Signed)
He called and said he is 50% better

## 2015-10-07 ENCOUNTER — Encounter: Payer: Self-pay | Admitting: Family Medicine

## 2015-10-07 ENCOUNTER — Ambulatory Visit (INDEPENDENT_AMBULATORY_CARE_PROVIDER_SITE_OTHER): Payer: BLUE CROSS/BLUE SHIELD | Admitting: Family Medicine

## 2015-10-07 VITALS — BP 120/82 | HR 100 | Ht 71.0 in | Wt 158.0 lb

## 2015-10-07 DIAGNOSIS — Z9119 Patient's noncompliance with other medical treatment and regimen: Secondary | ICD-10-CM

## 2015-10-07 DIAGNOSIS — Z91199 Patient's noncompliance with other medical treatment and regimen due to unspecified reason: Secondary | ICD-10-CM

## 2015-10-07 DIAGNOSIS — F901 Attention-deficit hyperactivity disorder, predominantly hyperactive type: Secondary | ICD-10-CM | POA: Diagnosis not present

## 2015-10-07 DIAGNOSIS — Z720 Tobacco use: Secondary | ICD-10-CM | POA: Diagnosis not present

## 2015-10-07 DIAGNOSIS — Z21 Asymptomatic human immunodeficiency virus [HIV] infection status: Secondary | ICD-10-CM

## 2015-10-07 DIAGNOSIS — F32A Depression, unspecified: Secondary | ICD-10-CM

## 2015-10-07 DIAGNOSIS — F418 Other specified anxiety disorders: Secondary | ICD-10-CM

## 2015-10-07 DIAGNOSIS — Z9889 Other specified postprocedural states: Secondary | ICD-10-CM | POA: Diagnosis not present

## 2015-10-07 DIAGNOSIS — F329 Major depressive disorder, single episode, unspecified: Secondary | ICD-10-CM

## 2015-10-07 DIAGNOSIS — K219 Gastro-esophageal reflux disease without esophagitis: Secondary | ICD-10-CM

## 2015-10-07 DIAGNOSIS — Z79899 Other long term (current) drug therapy: Secondary | ICD-10-CM | POA: Diagnosis not present

## 2015-10-07 DIAGNOSIS — Z0000 Encounter for general adult medical examination without abnormal findings: Secondary | ICD-10-CM | POA: Diagnosis not present

## 2015-10-07 DIAGNOSIS — F17200 Nicotine dependence, unspecified, uncomplicated: Secondary | ICD-10-CM

## 2015-10-07 DIAGNOSIS — F419 Anxiety disorder, unspecified: Secondary | ICD-10-CM

## 2015-10-07 LAB — CBC WITH DIFFERENTIAL/PLATELET
Basophils Absolute: 0 cells/uL (ref 0–200)
Basophils Relative: 0 %
Eosinophils Absolute: 51 cells/uL (ref 15–500)
Eosinophils Relative: 1 %
HEMATOCRIT: 44.2 % (ref 38.5–50.0)
HEMOGLOBIN: 15.3 g/dL (ref 13.2–17.1)
LYMPHS ABS: 1122 {cells}/uL (ref 850–3900)
Lymphocytes Relative: 22 %
MCH: 34.3 pg — ABNORMAL HIGH (ref 27.0–33.0)
MCHC: 34.6 g/dL (ref 32.0–36.0)
MCV: 99.1 fL (ref 80.0–100.0)
MONO ABS: 765 {cells}/uL (ref 200–950)
MPV: 8.9 fL (ref 7.5–12.5)
Monocytes Relative: 15 %
NEUTROS PCT: 62 %
Neutro Abs: 3162 cells/uL (ref 1500–7800)
Platelets: 258 10*3/uL (ref 140–400)
RBC: 4.46 MIL/uL (ref 4.20–5.80)
RDW: 13.3 % (ref 11.0–15.0)
WBC: 5.1 10*3/uL (ref 4.0–10.5)

## 2015-10-07 LAB — LIPID PANEL
Cholesterol: 211 mg/dL — ABNORMAL HIGH (ref 125–200)
HDL: 99 mg/dL (ref >=40)
LDL CALC: 86 mg/dL (ref <130)
TRIGLYCERIDES: 130 mg/dL (ref <150)
Total CHOL/HDL Ratio: 2.1 Ratio (ref <=5.0)
VLDL: 26 mg/dL (ref <30)

## 2015-10-07 LAB — COMPREHENSIVE METABOLIC PANEL
ALBUMIN: 3.8 g/dL (ref 3.6–5.1)
ALK PHOS: 120 U/L — AB (ref 40–115)
ALT: 41 U/L (ref 9–46)
AST: 50 U/L — AB (ref 10–35)
BUN: 4 mg/dL — ABNORMAL LOW (ref 7–25)
CALCIUM: 8.9 mg/dL (ref 8.6–10.3)
CO2: 21 mmol/L (ref 20–31)
Chloride: 99 mmol/L (ref 98–110)
Creat: 0.77 mg/dL (ref 0.70–1.33)
Glucose, Bld: 88 mg/dL (ref 65–99)
Potassium: 4.5 mmol/L (ref 3.5–5.3)
Sodium: 130 mmol/L — ABNORMAL LOW (ref 135–146)
Total Bilirubin: 0.5 mg/dL (ref 0.2–1.2)
Total Protein: 7 g/dL (ref 6.1–8.1)

## 2015-10-07 MED ORDER — EFAVIRENZ-EMTRICITAB-TENOFOVIR 600-200-300 MG PO TABS: 1.0000 | ORAL_TABLET | Freq: Every day | ORAL | Status: DC

## 2015-10-07 MED ORDER — VENLAFAXINE HCL 100 MG PO TABS: ORAL_TABLET | ORAL | Status: DC

## 2015-10-07 MED ORDER — SULFAMETHOXAZOLE-TRIMETHOPRIM 800-160 MG PO TABS: 1.0000 | ORAL_TABLET | Freq: Every day | ORAL | Status: AC

## 2015-10-07 NOTE — Progress Notes (Signed)
Subjective:    Patient ID: [name] [name], male    DOB: February 09, 1961, 55 y.o.   MRN: [id number]  HPI  he is here for complete examination. He now has insurance. He was without insurance for approximately a year and  Has been taking his HIV medications on an every other day basis. He was also taking the antibiotic every day as well. Does complain of an intermittent headache but usually goes away fairly quickly. He does have ADHD and is using his stimulant very rarely. He does have reflux disease and is using Prilosec with good results. He continues to smoke and intermittently does use patches. He does have underlying anxiety and again has been using his psychotropic medication sparingly and at a lower dose but says it is working fairly well. He complains of bilateral hip pain mainly when he stands but no other times and has no other problems with other joints. He describes a gagging type sensation when he lies on his back but to a lesser extent on this side. He does not describe this as acid.  He has a history of previous anterior cruciate ligament repair but is doing well with that.Presently he is not working but is going back to school. The rest of his family and social history as well as health maintenance and immunizations were reviewed. He has no other concerns or complaints.   Review of Systems  All other systems reviewed and are negative.      Objective:   Physical Exam BP 120/82 mmHg  Pulse 100  Ht 5\' 11"  (1.803 m)  Wt 158 lb (71.668 kg)  BMI 22.05 kg/m2  General Appearance:    Alert, cooperative, no distress, appears stated age  Head:    Normocephalic, without obvious abnormality, atraumatic  Eyes:    PERRL, conjunctiva/corneas clear, EOM's intact, fundi    benign  Ears:    Normal TM's and external ear canals  Nose:   Nares normal, mucosa normal, no drainage or sinus   tenderness  Throat:   Lips, mucosa, and tongue normal; teeth and gums normal  Neck:   Supple, no  lymphadenopathy;  thyroid:  no   enlargement/tenderness/nodules; no carotid   bruit or JVD  Back:    Spine nontender, no curvature, ROM normal, no CVA     tenderness  Lungs:     Clear to auscultation bilaterally without wheezes, rales or     ronchi; respirations unlabored  Chest Wall:    No tenderness or deformity   Heart:    Regular rate and rhythm, S1 and S2 normal, no murmur, rub   or gallop  Breast Exam:    No chest wall tenderness, masses or gynecomastia  Abdomen:     Soft, non-tender, nondistended, normoactive bowel sounds,    no masses, no hepatosplenomegaly  Genitalia:    Normal male external genitalia without lesions.  Testicles without masses.  No inguinal hernias.     Extremities:   No clubbing, cyanosis or edema  Pulses:   2+ and symmetric all extremities  Skin:   Skin color, texture, turgor normal, no rashes or lesions  Lymph nodes:   Cervical, supraclavicular, and axillary nodes normal  Neurologic:   CNII-XII intact, normal strength, sensation and gait; reflexes 2+ and symmetric throughout          Psych:   Normal mood, affect, hygiene and grooming.          Assessment & Plan:  Routine general medical examination at a  health care facility - Plan: CBC with Differential/Platelet, Comprehensive metabolic panel, Lipid panel  Personal history of noncompliance with medical treatment, presenting hazards to health  HIV positive (HCC) - Plan: HIV 1 RNA quant-no reflex-bld, T-helper cells (CD4) count (not at [facility]), sulfamethoxazole-trimethoprim (BACTRIM DS,SEPTRA DS) 800-160 MG tablet, efavirenz-emtricitabine-tenofovir (ATRIPLA) 600-200-300 MG tablet  Anxiety and depression - Plan: venlafaxine (EFFEXOR) 100 MG tablet  S/P ACL repair  ADHD (attention deficit hyperactivity disorder), predominantly hyperactive impulsive type  Smoker  Encounter for long-term (current) use of other high-risk medications  Gastroesophageal reflux disease without esophagitis  Discussed smoking  cessation with him and his not interested. Strongly encouraged him to take medications as directed. We will attempt to help get his Atripla at a lesser cost. He will continue on the Prilosec to take this at night to see if it'll help with his gagging like symptoms. He does not need fill on his ADD med.

## 2015-10-08 ENCOUNTER — Telehealth: Payer: Self-pay | Admitting: Internal Medicine

## 2015-10-08 DIAGNOSIS — K219 Gastro-esophageal reflux disease without esophagitis: Secondary | ICD-10-CM

## 2015-10-08 DIAGNOSIS — Z21 Asymptomatic human immunodeficiency virus [HIV] infection status: Secondary | ICD-10-CM

## 2015-10-08 LAB — T-HELPER CELLS (CD4) COUNT (NOT AT ARMC)
ABSOLUTE CD4: 247 {cells}/uL — AB (ref 381–1469)
CD4 T Helper %: 21 % — ABNORMAL LOW (ref 32–62)
Total lymphocyte count: 1155 cells/uL (ref 700–3300)

## 2015-10-08 LAB — HIV-1 RNA QUANT-NO REFLEX-BLD
HIV 1 RNA Quant: 21 copies/mL (ref <20)
HIV-1 RNA QUANT, LOG: 1.32 {Log_copies}/mL — AB (ref <1.30)

## 2015-10-08 MED ORDER — EFAVIRENZ-EMTRICITAB-TENOFOVIR 600-200-300 MG PO TABS: 1.0000 | ORAL_TABLET | Freq: Every day | ORAL | Status: AC

## 2015-10-08 MED ORDER — OMEPRAZOLE 20 MG PO CPDR: 20.0000 mg | DELAYED_RELEASE_CAPSULE | Freq: Every day | ORAL | Status: AC

## 2015-10-08 NOTE — Telephone Encounter (Signed)
Pt called back and his [facility]a [facility] time due to his insurance. i have sent that in for #30 with 11 refills

## 2015-10-08 NOTE — Telephone Encounter (Signed)
Pt called and wanted to make sure all his meds were sent to local pharmacy except for atripla which goes to a [facility] which he is going to find out if his insurance will pay for 30 days or 90 days. Pt will call back and let me know so i can refill his atripla as (Dr. Susann GivensLalonde refilled it to local pharmacy)

## 2015-10-12 ENCOUNTER — Telehealth: Payer: Self-pay | Admitting: Family Medicine

## 2015-10-12 NOTE — Telephone Encounter (Signed)
Researched & Atripla has a co pay assistance program t# [phone] which will help lower the cost of this medication

## 2015-10-13 ENCOUNTER — Other Ambulatory Visit: Payer: Self-pay

## 2015-10-13 DIAGNOSIS — E871 Hypo-osmolality and hyponatremia: Secondary | ICD-10-CM

## 2015-10-13 NOTE — Telephone Encounter (Signed)
Left message for pt

## 2015-10-15 NOTE — Telephone Encounter (Signed)
Pt called back and spoke with Tammy,  stated it has been taken care of and medication is on the way

## 2015-11-13 ENCOUNTER — Other Ambulatory Visit: Payer: Self-pay

## 2016-02-26 ENCOUNTER — Telehealth: Payer: Self-pay | Admitting: Family Medicine

## 2016-02-26 NOTE — Telephone Encounter (Signed)
See how he is doing 

## 2016-02-26 NOTE — Telephone Encounter (Signed)
Pt called and stated that he had gone to a Urgent Care and was told he had a broken rib. He made an appt with [facility] for Monday. He then called back and stated that the urgent care called him back and told him the xrays are back and he actually has 4 fractured ribs, fluid in his lungs and a punctured lung and was advised to go to the ER. He called and wanted to know what to do. Pt was advised to listen to the advise of Urgent Care and go to ER.  Pt advised he would go to [facility]. Appt for Monday was cancelled.

## 2016-02-27 ENCOUNTER — Emergency Department (HOSPITAL_COMMUNITY): Payer: BLUE CROSS/BLUE SHIELD

## 2016-02-27 ENCOUNTER — Encounter (HOSPITAL_COMMUNITY): Payer: Self-pay | Admitting: *Deleted

## 2016-02-27 ENCOUNTER — Inpatient Hospital Stay (HOSPITAL_COMMUNITY)
Admission: EM | Admit: 2016-02-27 | Discharge: 2016-03-05 | DRG: 163 | Disposition: A | Discharge status: Home / Self Care | Payer: BLUE CROSS/BLUE SHIELD | Attending: Cardiothoracic Surgery | Admitting: Cardiothoracic Surgery

## 2016-02-27 DIAGNOSIS — F909 Attention-deficit hyperactivity disorder, unspecified type: Secondary | ICD-10-CM | POA: Diagnosis present

## 2016-02-27 DIAGNOSIS — Z79899 Other long term (current) drug therapy: Secondary | ICD-10-CM

## 2016-02-27 DIAGNOSIS — K219 Gastro-esophageal reflux disease without esophagitis: Secondary | ICD-10-CM | POA: Diagnosis present

## 2016-02-27 DIAGNOSIS — Z9689 Presence of other specified functional implants: Secondary | ICD-10-CM

## 2016-02-27 DIAGNOSIS — Z9889 Other specified postprocedural states: Secondary | ICD-10-CM

## 2016-02-27 DIAGNOSIS — Z938 Other artificial opening status: Secondary | ICD-10-CM

## 2016-02-27 DIAGNOSIS — S271XXA Traumatic hemothorax, initial encounter: Principal | ICD-10-CM | POA: Diagnosis present

## 2016-02-27 DIAGNOSIS — E871 Hypo-osmolality and hyponatremia: Secondary | ICD-10-CM | POA: Diagnosis present

## 2016-02-27 DIAGNOSIS — Z01811 Encounter for preprocedural respiratory examination: Secondary | ICD-10-CM

## 2016-02-27 DIAGNOSIS — Z22322 Carrier or suspected carrier of Methicillin resistant Staphylococcus aureus: Secondary | ICD-10-CM

## 2016-02-27 DIAGNOSIS — D649 Anemia, unspecified: Secondary | ICD-10-CM

## 2016-02-27 DIAGNOSIS — F17210 Nicotine dependence, cigarettes, uncomplicated: Secondary | ICD-10-CM | POA: Diagnosis present

## 2016-02-27 DIAGNOSIS — R0602 Shortness of breath: Secondary | ICD-10-CM | POA: Diagnosis not present

## 2016-02-27 DIAGNOSIS — B20 Human immunodeficiency virus [HIV] disease: Secondary | ICD-10-CM | POA: Diagnosis present

## 2016-02-27 DIAGNOSIS — D62 Acute posthemorrhagic anemia: Secondary | ICD-10-CM | POA: Diagnosis present

## 2016-02-27 DIAGNOSIS — J942 Hemothorax: Secondary | ICD-10-CM | POA: Diagnosis present

## 2016-02-27 DIAGNOSIS — W01190A Fall on same level from slipping, tripping and stumbling with subsequent striking against furniture, initial encounter: Secondary | ICD-10-CM | POA: Diagnosis present

## 2016-02-27 DIAGNOSIS — Z792 Long term (current) use of antibiotics: Secondary | ICD-10-CM

## 2016-02-27 DIAGNOSIS — E162 Hypoglycemia, unspecified: Secondary | ICD-10-CM | POA: Diagnosis not present

## 2016-02-27 DIAGNOSIS — S2242XA Multiple fractures of ribs, left side, initial encounter for closed fracture: Secondary | ICD-10-CM

## 2016-02-27 LAB — CBC WITH DIFFERENTIAL/PLATELET
Basophils Absolute: 0 10*3/uL (ref 0.0–0.1)
Basophils Relative: 0 %
EOS PCT: 2 %
Eosinophils Absolute: 0.1 10*3/uL (ref 0.0–0.7)
HCT: 27.5 % — ABNORMAL LOW (ref 39.0–52.0)
Hemoglobin: 9.7 g/dL — ABNORMAL LOW (ref 13.0–17.0)
LYMPHS ABS: 0.6 10*3/uL — AB (ref 0.7–4.0)
LYMPHS PCT: 9 %
MCH: 33.2 pg (ref 26.0–34.0)
MCHC: 35.3 g/dL (ref 30.0–36.0)
MCV: 94.2 fL (ref 78.0–100.0)
MONO ABS: 0.7 10*3/uL (ref 0.1–1.0)
MONOS PCT: 12 %
Neutro Abs: 4.8 10*3/uL (ref 1.7–7.7)
Neutrophils Relative %: 77 %
PLATELETS: 364 10*3/uL (ref 150–400)
RBC: 2.92 MIL/uL — ABNORMAL LOW (ref 4.22–5.81)
RDW: 12 % (ref 11.5–15.5)
WBC: 6.2 10*3/uL (ref 4.0–10.5)

## 2016-02-27 LAB — BASIC METABOLIC PANEL
Anion gap: 11 (ref 5–15)
BUN: 7 mg/dL (ref 6–20)
CO2: 20 mmol/L — ABNORMAL LOW (ref 22–32)
Calcium: 8.1 mg/dL — ABNORMAL LOW (ref 8.9–10.3)
Chloride: 89 mmol/L — ABNORMAL LOW (ref 101–111)
Creatinine, Ser: 0.64 mg/dL (ref 0.61–1.24)
GFR calc Af Amer: >60 mL/min (ref >60)
GLUCOSE: 96 mg/dL (ref 65–99)
POTASSIUM: 3.7 mmol/L (ref 3.5–5.1)
Sodium: 120 mmol/L — ABNORMAL LOW (ref 135–145)

## 2016-02-27 LAB — LACTIC ACID, PLASMA: Lactic Acid, Venous: 1.5 mmol/L (ref 0.5–1.9)

## 2016-02-27 LAB — PROTIME-INR
INR: 1.45
Prothrombin Time: 17.8 seconds — ABNORMAL HIGH (ref 11.4–15.2)

## 2016-02-27 MED ORDER — FENTANYL CITRATE (PF) 100 MCG/2ML IJ SOLN
25.0000 ug | INTRAMUSCULAR | Status: AC | PRN
  Administered 2016-02-27 (×2): 25 ug via INTRAVENOUS
  Filled 2016-02-27 (×2): qty 2

## 2016-02-27 MED ORDER — SODIUM CHLORIDE 0.9 % IV SOLN
INTRAVENOUS | Status: DC
  Administered 2016-02-27 – 2016-02-29 (×4): via INTRAVENOUS

## 2016-02-27 NOTE — ED Notes (Signed)
Pt fell three weeks ago- was CT on 10/13 found to have rib fxs and fluid build up at lung.  Supposed to come yesterday but chose to come today instead

## 2016-02-27 NOTE — ED Provider Notes (Signed)
[facility]Y DEPT Provider Note   CSN: [id number] Arrival date & time: 02/27/16  1945     History   Chief Complaint Chief Complaint  Patient presents with  . Shortness of Breath    HPI Elias ElseDarrell L Sahagun is a 55 y.o. male.  HPI  Pt was seen at 2030. Per pt, c/o gradual onset and persistence of constant left sided thoracic back "pain" that began 3 weeks ago. Pt states he tripped and fell 3 weeks ago, hitting his left posterior ribs against a piece of furniture. Pt states he was evaluated at a local [facility] yesterday, and was told he "had rib fractures and a punctured lung." Pt states he "had things to do" yesterday, so he came to the ED today for follow up. Denies CP/palpitations, no cough, no abd pain, no N/V/D, no back pain, no neck pain, no focal motor weakness, no tingling/numbness in extremities.    Past Medical History:  Diagnosis Date  . GERD (gastroesophageal reflux disease)   . Hemorrhoids   . HIV infection (HCC)   . Smoker   . Ulnar neuropathy at elbow of left upper extremity 10/01/2014    Patient Active Problem List   Diagnosis Date Noted  . Smoker 12/16/2014  . Encounter for long-term (current) use of other high-risk medications 12/16/2014  . Gastroesophageal reflux disease without esophagitis 12/16/2014  . Ulnar neuropathy at elbow of left upper extremity 10/01/2014  . ADHD (attention deficit hyperactivity disorder), predominantly hyperactive impulsive type 07/31/2013  . S/P ACL repair 12/07/2010  . HIV positive (HCC) 11/18/2010  . Anxiety and depression 11/18/2010    Past Surgical History:  Procedure Laterality Date  . COLONOSCOPY  2008  . KNEE ARTHROSCOPY WITH ANTERIOR CRUCIATE LIGAMENT (ACL) REPAIR Right 09/2011       Home Medications    Prior to Admission medications   Medication Sig Start Date End Date Taking? Authorizing Provider  efavirenz-emtricitabine-tenofovir (ATRIPLA) 600-200-300 MG tablet Take 1 tablet by mouth at bedtime. 10/08/15  Yes Ronnald NianJohn C  Lalonde, MD  Melatonin 10 MG TABS Take 1 tablet by mouth at bedtime.    Yes Historical Provider, MD  Multiple Vitamin (MULTIVITAMIN WITH MINERALS) TABS tablet Take 1 tablet by mouth daily.   Yes Historical Provider, MD  naproxen sodium (ALEVE) 220 MG tablet Take 220 mg by mouth daily as needed (for pain).    Yes Historical Provider, MD  omeprazole (PRILOSEC) 20 MG capsule Take 1 capsule (20 mg total) by mouth daily. 10/08/15  Yes Ronnald NianJohn C Lalonde, MD  sulfamethoxazole-trimethoprim (BACTRIM DS,SEPTRA DS) 800-160 MG tablet Take 1 tablet by mouth daily. 10/07/15  Yes Ronnald NianJohn C Lalonde, MD  venlafaxine (EFFEXOR) 100 MG tablet TAKE 1 TABLET (100 MG TOTAL) BY MOUTH 2 (TWO) TIMES DAILY. Patient taking differently: Take 50 mg by mouth daily.  10/07/15  Yes Ronnald NianJohn C Lalonde, MD  methylphenidate (RITALIN) 10 MG tablet Take 1 tablet (10 mg total) by mouth 2 (two) times daily. Patient not taking: Reported on 10/07/2015 12/16/14   Ronnald NianJohn C Lalonde, MD    Family History Family History  Problem Relation Age of Onset  . Cancer Mother   . Cancer Father   . Cancer Paternal Uncle   . Cancer Maternal Grandmother     Social History Social History  Substance Use Topics  . Smoking status: Current Every Day Smoker    Packs/day: 0.50    Types: Cigarettes  . Smokeless tobacco: Never Used  . Alcohol use 0.0 oz/week     Comment:  occ     Allergies   Penicillins   Review of Systems Review of Systems ROS: Statement: All systems negative except as marked or noted in the HPI; Constitutional: Negative for fever and chills. ; ; Eyes: Negative for eye pain, redness and discharge. ; ; ENMT: Negative for ear pain, hoarseness, nasal congestion, sinus pressure and sore throat. ; ; Cardiovascular: Negative for chest pain, palpitations, diaphoresis, dyspnea and peripheral edema. ; ; Respiratory: Negative for cough, wheezing and stridor. ; ; Gastrointestinal: Negative for nausea, vomiting, diarrhea, abdominal pain, blood in stool,  hematemesis, jaundice and rectal bleeding. . ; ; Genitourinary: Negative for dysuria, flank pain and hematuria. ; ; Musculoskeletal: +left thoracic back/ribs pain. Negative for neck pain. Negative for swelling and deformity..; ; Skin: Negative for pruritus, rash, abrasions, blisters, bruising and skin lesion.; ; Neuro: Negative for headache, lightheadedness and neck stiffness. Negative for weakness, altered level of consciousness, altered mental status, extremity weakness, paresthesias, involuntary movement, seizure and syncope.       Physical Exam Updated Vital Signs BP 115/79 (BP Location: Left Arm)   Pulse 108   Temp 98.1 F (36.7 C) (Oral)   Resp 17   Ht 6' (1.829 m)   Wt 145 lb (65.8 kg)   SpO2 100%   BMI 19.67 kg/m    Patient Vitals for the past 24 hrs:  BP Temp Temp src Pulse Resp SpO2 Height Weight  02/27/16 2230 116/80 - - 106 - 100 % - -  02/27/16 2200 115/81 - - 107 - 100 % - -  02/27/16 2145 115/79 - - 108 17 100 % - -  02/27/16 2130 113/87 - - 106 - 100 % - -  02/27/16 2100 109/77 - - 110 - 99 % - -  02/27/16 2049 113/91 - - 113 18 100 % - -  02/27/16 2045 113/91 - - - - - - -  02/27/16 2023 99/74 98.1 F (36.7 C) Oral 118 18 100 % 6' (1.829 m) 145 lb (65.8 kg)     Physical Exam 2035: Physical examination:  Nursing notes reviewed; Vital signs and O2 SAT reviewed;  Constitutional: Thin, frail. Uncomfortable appearing.; Head:  Normocephalic, atraumatic; Eyes: EOMI, PERRL, No scleral icterus; ENMT: Mouth and pharynx normal, Mucous membranes moist; Neck: Supple, Full range of motion, No lymphadenopathy; Cardiovascular: Regular rate and rhythm, No gallop; Respiratory: Breath sounds clear bilaterally, diminished left base. No wheezes.  Speaking full sentences with ease, Normal respiratory effort/excursion; Chest: Nontender, Movement normal; Abdomen: Soft, Nontender, Nondistended, Normal bowel sounds; Genitourinary: No CVA tenderness; Spine:  No midline CS, TS, LS tenderness.  +TTP left thoracic back/posterior ribs with palp bony crepitus. No ecchymosis, no rash.;; Extremities: Pulses normal, No tenderness, No edema, No calf edema or asymmetry.; Neuro: AA&Ox3, Major CN grossly intact.  Speech clear. No gross focal motor or sensory deficits in extremities. Climbs on and off stretcher easily by himself. Gait steady.; Skin: Color normal, Warm, Dry.   ED Treatments / Results  Labs (all labs ordered are listed, but only abnormal results are displayed)   EKG  EKG Interpretation  Date/Time:  Saturday February 27 2016 23:05:50 EDT Ventricular Rate:  106 PR Interval:    QRS Duration: 106 QT Interval:  357 QTC Calculation: 475 R Axis:   73 Text Interpretation:  Sinus tachycardia Baseline wander No old tracing to compare Confirmed by [facility]  MD, Nicholos Johns [phone]) on 02/27/2016 11:09:32 PM       Radiology   Procedures Procedures (including critical care  time)  Medications Ordered in ED Medications  fentaNYL (SUBLIMAZE) injection 25 mcg (25 mcg Intravenous Given 02/27/16 2140)     Initial Impression / Assessment and Plan / ED Course  I have reviewed the triage vital signs and the nursing notes.  Pertinent labs & imaging results that were available during my care of the patient were reviewed by me and considered in my medical decision making (see chart for details).  MDM Reviewed: previous chart, nursing note and vitals Reviewed previous: labs Interpretation: labs and x-ray Total time providing critical care: 30-74 minutes. This excludes time spent performing separately reportable procedures and services. Consults: general surgery and cardiovascular surgery   CRITICAL CARE Performed by: Laray Anger Total critical care time: 35 minutes Critical care time was exclusive of separately billable procedures and treating other patients. Critical care was necessary to treat or prevent imminent or life-threatening deterioration. Critical care was time  spent personally by me on the following activities: development of treatment plan with patient and/or surrogate as well as nursing, discussions with consultants, evaluation of patient's response to treatment, examination of patient, obtaining history from patient or surrogate, ordering and performing treatments and interventions, ordering and review of laboratory studies, ordering and review of radiographic studies, pulse oximetry and re-evaluation of patient's condition.   Results for orders placed or performed during the hospital encounter of 02/27/16  CBC with Differential  Result Value Ref Range   WBC 6.2 4.0 - 10.5 K/uL   RBC 2.92 (L) 4.22 - 5.81 MIL/uL   Hemoglobin 9.7 (L) 13.0 - 17.0 g/dL   HCT 40.9 (L) 81.1 - 91.4 %   MCV 94.2 78.0 - 100.0 fL   MCH 33.2 26.0 - 34.0 pg   MCHC 35.3 30.0 - 36.0 g/dL   RDW 78.2 95.6 - 21.3 %   Platelets 364 150 - 400 K/uL   Neutrophils Relative % 77 %   Neutro Abs 4.8 1.7 - 7.7 K/uL   Lymphocytes Relative 9 %   Lymphs Abs 0.6 (L) 0.7 - 4.0 K/uL   Monocytes Relative 12 %   Monocytes Absolute 0.7 0.1 - 1.0 K/uL   Eosinophils Relative 2 %   Eosinophils Absolute 0.1 0.0 - 0.7 K/uL   Basophils Relative 0 %   Basophils Absolute 0.0 0.0 - 0.1 K/uL  Basic metabolic panel  Result Value Ref Range   Sodium 120 (L) 135 - 145 mmol/L   Potassium 3.7 3.5 - 5.1 mmol/L   Chloride 89 (L) 101 - 111 mmol/L   CO2 20 (L) 22 - 32 mmol/L   Glucose, Bld 96 65 - 99 mg/dL   BUN 7 6 - 20 mg/dL   Creatinine, Ser 0.86 0.61 - 1.24 mg/dL   Calcium 8.1 (L) 8.9 - 10.3 mg/dL   GFR calc non Af Amer >60 >60 mL/min   GFR calc Af Amer >60 >60 mL/min   Anion gap 11 5 - 15  Protime-INR  Result Value Ref Range   Prothrombin Time 17.8 (H) 11.4 - 15.2 seconds   INR 1.45   Lactic acid, plasma  Result Value Ref Range   Lactic Acid, Venous 1.5 0.5 - 1.9 mmol/L    Dg Ribs Unilateral W/chest Left Result Date: 02/27/2016 CLINICAL DATA:  Fall 3 weeks ago with pain. EXAM: LEFT RIBS  AND CHEST - 3+ VIEW COMPARISON:  Yesterday FINDINGS: Moderate loculated pleural effusion at the lateral left base. Pneumothorax is not confidently identified today. The underlying lung is at least atelectatic. Suspected contusion on  prior study is not seen today and may have been pleural fluid. Hyperinflation of the right lung. Normal heart size and mediastinal contours. Posterior left seventh, eighth, ninth, and tenth rib fractures with up to moderate displacement. IMPRESSION: 1. Moderate loculated left pneumothorax, presumably hemothorax in the setting of acute left seventh through tenth rib fractures. No visible pneumothorax. 2. Right diaphragm flattening, possible COPD. Electronically Signed   By: Marnee Spring M.D.   On: 02/27/2016 21:46    2210:  Pt remains with 100% O2 Sats on R/A, resps without distress, and is more comfortable after IV pain meds.  T/C to General Surgery Dr. Lovell Sheehan, case discussed, including:  HPI, pertinent PM/SHx, VS/PE, dx testing, ED course and treatment: states pt will need transfer to [facility] for CTS evaluation. T/C to [facility] CTS Dr. Donata Clay, case discussed, including:  HPI, pertinent PM/SHx, VS/PE, dx testing, ED course and treatment:  Agreeable to admit, requests to transfer pt to [facility] ED. [facility] EDP Dr. Silverio Lay and Charge RN both given report.     Final Clinical Impressions(s) / ED Diagnoses   Final diagnoses:  None    New Prescriptions New Prescriptions   No medications on file     Samuel Jester, DO 03/01/16 1944

## 2016-02-27 NOTE — ED Triage Notes (Signed)
Pt states he fell 3 weeks ago and decided to go to an urgent care yesterday where he was told he had 4 fractured ribs and 1 of them were broken. Pt was also told yesterday that he had a punctured lung with fluid build up and that he needed to come straight to the emergency room. Pt was sent home with a cd of his xrays. Pt states he was too tired to come to the ED yesterday.

## 2016-02-27 NOTE — ED Notes (Signed)
Pt reports that he smokes about 1/2PPD and that his pain is 10 - comfort measures Dr Clarene DukeMcManus has seen

## 2016-02-27 NOTE — ED Notes (Signed)
Physician Dr Bethena RoysMcManness in to discuss care with t and [facility]

## 2016-02-28 ENCOUNTER — Inpatient Hospital Stay (HOSPITAL_COMMUNITY): Payer: BLUE CROSS/BLUE SHIELD

## 2016-02-28 ENCOUNTER — Encounter (HOSPITAL_COMMUNITY): Payer: Self-pay | Admitting: Radiology

## 2016-02-28 DIAGNOSIS — E162 Hypoglycemia, unspecified: Secondary | ICD-10-CM | POA: Diagnosis not present

## 2016-02-28 DIAGNOSIS — Z79899 Other long term (current) drug therapy: Secondary | ICD-10-CM | POA: Diagnosis not present

## 2016-02-28 DIAGNOSIS — F17210 Nicotine dependence, cigarettes, uncomplicated: Secondary | ICD-10-CM | POA: Diagnosis present

## 2016-02-28 DIAGNOSIS — K219 Gastro-esophageal reflux disease without esophagitis: Secondary | ICD-10-CM | POA: Diagnosis present

## 2016-02-28 DIAGNOSIS — R0602 Shortness of breath: Secondary | ICD-10-CM | POA: Diagnosis present

## 2016-02-28 DIAGNOSIS — W01190A Fall on same level from slipping, tripping and stumbling with subsequent striking against furniture, initial encounter: Secondary | ICD-10-CM | POA: Diagnosis present

## 2016-02-28 DIAGNOSIS — B20 Human immunodeficiency virus [HIV] disease: Secondary | ICD-10-CM | POA: Diagnosis present

## 2016-02-28 DIAGNOSIS — Z22322 Carrier or suspected carrier of Methicillin resistant Staphylococcus aureus: Secondary | ICD-10-CM | POA: Diagnosis not present

## 2016-02-28 DIAGNOSIS — S2242XA Multiple fractures of ribs, left side, initial encounter for closed fracture: Secondary | ICD-10-CM | POA: Diagnosis not present

## 2016-02-28 DIAGNOSIS — J942 Hemothorax: Secondary | ICD-10-CM | POA: Diagnosis present

## 2016-02-28 DIAGNOSIS — E871 Hypo-osmolality and hyponatremia: Secondary | ICD-10-CM | POA: Diagnosis present

## 2016-02-28 DIAGNOSIS — Z792 Long term (current) use of antibiotics: Secondary | ICD-10-CM | POA: Diagnosis not present

## 2016-02-28 DIAGNOSIS — D62 Acute posthemorrhagic anemia: Secondary | ICD-10-CM | POA: Diagnosis present

## 2016-02-28 DIAGNOSIS — S271XXA Traumatic hemothorax, initial encounter: Secondary | ICD-10-CM | POA: Diagnosis present

## 2016-02-28 DIAGNOSIS — F909 Attention-deficit hyperactivity disorder, unspecified type: Secondary | ICD-10-CM | POA: Diagnosis present

## 2016-02-28 LAB — COMPREHENSIVE METABOLIC PANEL
ALT: 15 U/L — ABNORMAL LOW (ref 17–63)
AST: 23 U/L (ref 15–41)
Albumin: 2 g/dL — ABNORMAL LOW (ref 3.5–5.0)
Alkaline Phosphatase: 140 U/L — ABNORMAL HIGH (ref 38–126)
Anion gap: 10 (ref 5–15)
BUN: 6 mg/dL (ref 6–20)
CO2: 17 mmol/L — ABNORMAL LOW (ref 22–32)
Calcium: 7.8 mg/dL — ABNORMAL LOW (ref 8.9–10.3)
Chloride: 94 mmol/L — ABNORMAL LOW (ref 101–111)
Creatinine, Ser: 0.65 mg/dL (ref 0.61–1.24)
GFR calc Af Amer: >60 mL/min (ref >60)
GFR calc non Af Amer: >60 mL/min (ref >60)
Glucose, Bld: 96 mg/dL (ref 65–99)
Potassium: 3.7 mmol/L (ref 3.5–5.1)
Sodium: 121 mmol/L — ABNORMAL LOW (ref 135–145)
Total Bilirubin: 0.6 mg/dL (ref 0.3–1.2)
Total Protein: 5 g/dL — ABNORMAL LOW (ref 6.5–8.1)

## 2016-02-28 LAB — PROTIME-INR
INR: 1.55
Prothrombin Time: 18.8 seconds — ABNORMAL HIGH (ref 11.4–15.2)

## 2016-02-28 LAB — PREPARE RBC (CROSSMATCH)

## 2016-02-28 LAB — PREALBUMIN: Prealbumin: 5.8 mg/dL — ABNORMAL LOW (ref 18–38)

## 2016-02-28 LAB — SURGICAL PCR SCREEN
MRSA, PCR: POSITIVE — AB
Staphylococcus aureus: POSITIVE — AB

## 2016-02-28 LAB — APTT: aPTT: 37 seconds — ABNORMAL HIGH (ref 24–36)

## 2016-02-28 MED ORDER — ACETAMINOPHEN 650 MG RE SUPP: 650.0000 mg | Freq: Four times a day (QID) | RECTAL | Status: DC | PRN

## 2016-02-28 MED ORDER — OXYCODONE HCL 5 MG PO TABS
5.0000 mg | ORAL_TABLET | ORAL | Status: DC | PRN
  Administered 2016-02-28 – 2016-03-01 (×8): 5 mg via ORAL
  Filled 2016-02-28 (×8): qty 1

## 2016-02-28 MED ORDER — SENNA 8.6 MG PO TABS
1.0000 | ORAL_TABLET | Freq: Two times a day (BID) | ORAL | Status: DC
  Administered 2016-02-28: 8.6 mg via ORAL
  Filled 2016-02-28 (×3): qty 1

## 2016-02-28 MED ORDER — VITAMIN B-1 100 MG PO TABS
100.0000 mg | ORAL_TABLET | Freq: Every day | ORAL | Status: DC
  Administered 2016-02-28 – 2016-02-29 (×2): 100 mg via ORAL
  Filled 2016-02-28 (×3): qty 1

## 2016-02-28 MED ORDER — VENLAFAXINE HCL 50 MG PO TABS
50.0000 mg | ORAL_TABLET | Freq: Every day | ORAL | Status: DC
  Filled 2016-02-28 (×3): qty 1

## 2016-02-28 MED ORDER — FENTANYL CITRATE (PF) 100 MCG/2ML IJ SOLN
25.0000 ug | Freq: Once | INTRAMUSCULAR | Status: AC
  Administered 2016-02-28: 25 ug via INTRAVENOUS
  Filled 2016-02-28: qty 2

## 2016-02-28 MED ORDER — SORBITOL 70 % SOLN
30.0000 mL | Freq: Every day | Status: DC | PRN
  Filled 2016-02-28: qty 30

## 2016-02-28 MED ORDER — MELATONIN 3 MG PO TABS: 9.0000 mg | ORAL_TABLET | Freq: Every day | ORAL | Status: DC

## 2016-02-28 MED ORDER — SULFAMETHOXAZOLE-TRIMETHOPRIM 800-160 MG PO TABS
1.0000 | ORAL_TABLET | Freq: Every day | ORAL | Status: DC
  Administered 2016-02-28 – 2016-02-29 (×2): 1 via ORAL
  Filled 2016-02-28 (×2): qty 1

## 2016-02-28 MED ORDER — SODIUM CHLORIDE 0.9% FLUSH
3.0000 mL | Freq: Two times a day (BID) | INTRAVENOUS | Status: DC
  Administered 2016-02-28 – 2016-02-29 (×5): 3 mL via INTRAVENOUS

## 2016-02-28 MED ORDER — ADULT MULTIVITAMIN W/MINERALS CH
1.0000 | ORAL_TABLET | Freq: Every day | ORAL | Status: DC
  Administered 2016-02-28 – 2016-02-29 (×2): 1 via ORAL
  Filled 2016-02-28 (×2): qty 1

## 2016-02-28 MED ORDER — ACETAMINOPHEN 325 MG PO TABS: 650.0000 mg | ORAL_TABLET | Freq: Four times a day (QID) | ORAL | Status: DC | PRN

## 2016-02-28 MED ORDER — THIAMINE HCL 100 MG/ML IJ SOLN
Freq: Once | INTRAVENOUS | Status: AC
  Administered 2016-02-28: 04:00:00 via INTRAVENOUS
  Filled 2016-02-28: qty 1000

## 2016-02-28 MED ORDER — ASPIRIN EC 81 MG PO TBEC
81.0000 mg | DELAYED_RELEASE_TABLET | Freq: Every day | ORAL | Status: DC
  Administered 2016-02-28 – 2016-02-29 (×2): 81 mg via ORAL
  Filled 2016-02-28 (×2): qty 1

## 2016-02-28 MED ORDER — EFAVIRENZ-EMTRICITAB-TENOFOVIR 600-200-300 MG PO TABS
1.0000 | ORAL_TABLET | Freq: Every day | ORAL | Status: DC
  Administered 2016-02-28 – 2016-02-29 (×2): 1 via ORAL
  Filled 2016-02-28 (×2): qty 1

## 2016-02-28 MED ORDER — IOPAMIDOL (ISOVUE-300) INJECTION 61%
INTRAVENOUS | Status: AC
  Administered 2016-02-28: 75 mL
  Filled 2016-02-28: qty 75

## 2016-02-28 MED ORDER — ENSURE ENLIVE PO LIQD
237.0000 mL | Freq: Three times a day (TID) | ORAL | Status: DC
  Administered 2016-02-29: 237 mL via ORAL

## 2016-02-28 MED ORDER — PANTOPRAZOLE SODIUM 40 MG PO TBEC
40.0000 mg | DELAYED_RELEASE_TABLET | Freq: Every day | ORAL | Status: DC
  Administered 2016-02-28 – 2016-02-29 (×2): 40 mg via ORAL
  Filled 2016-02-28 (×2): qty 1

## 2016-02-28 NOTE — Progress Notes (Signed)
[address]       [name] [address]             [phone]         Subjective: Minor SOB, mostly comfortable at rest but does have some left posterior rib pain  Objective: Vital signs in last 24 hours: Temp:  [97.4 F (36.3 C)-98.1 F (36.7 C)] 97.4 F (36.3 C) (10/15 0345) Pulse Rate:  [99-118] 104 (10/15 0345) Cardiac Rhythm: Sinus tachycardia (10/15 0900) Resp:  [11-26] 18 (10/15 0345) BP: (99-120)/(63-91) 114/67 (10/15 0345) SpO2:  [94 %-100 %] 97 % (10/15 0345) Weight:  [138 lb 6.4 oz (62.8 kg)-145 lb (65.8 kg)] 138 lb 6.4 oz (62.8 kg) (10/15 0345)  Hemodynamic parameters for last 24 hours:    Intake/Output from previous day: 10/14 0701 - 10/15 0700 In: 411.7 [P.O.:50; I.V.:361.7] Out: -  Intake/Output this shift: No intake/output data recorded.  General appearance: alert, cooperative and no distress Heart: regular rate and rhythm Lungs: dim left base Abdomen: soft, nontender  Lab Results:  Recent Labs  02/27/16 2221  WBC 6.2  HGB 9.7*  HCT 27.5*  PLT 364   BMET:  Recent Labs  02/27/16 2221 02/28/16 0255  NA 120* 121*  K 3.7 3.7  CL 89* 94*  CO2 20* 17*  GLUCOSE 96 96  BUN 7 6  CREATININE 0.64 0.65  CALCIUM 8.1* 7.8*    PT/INR:  Recent Labs  02/28/16 0359  LABPROT 18.8*  INR 1.55   ABG No results found for: PHART, HCO3, TCO2, ACIDBASEDEF, O2SAT CBG (last 3)  No results for input(s): GLUCAP in the last 72 hours.  Meds Scheduled Meds: . aspirin EC  81 mg Oral Daily  . senna  1 tablet Oral BID  . sodium chloride flush  3 mL Intravenous Q12H  . thiamine  100 mg Oral Daily   Continuous Infusions: . sodium chloride 100 mL/hr at 02/28/16 0613   PRN Meds:.acetaminophen **OR** acetaminophen, oxyCODONE, sorbitol  Xrays Dg Ribs Unilateral W/chest Left  Result Date: 02/27/2016 CLINICAL DATA:  Fall 3 weeks ago with pain. EXAM: LEFT RIBS AND CHEST - 3+ VIEW COMPARISON:  Yesterday FINDINGS: Moderate loculated  pleural effusion at the lateral left base. Pneumothorax is not confidently identified today. The underlying lung is at least atelectatic. Suspected contusion on prior study is not seen today and may have been pleural fluid. Hyperinflation of the right lung. Normal heart size and mediastinal contours. Posterior left seventh, eighth, ninth, and tenth rib fractures with up to moderate displacement. IMPRESSION: 1. Moderate loculated left pneumothorax, presumably hemothorax in the setting of acute left seventh through tenth rib fractures. No visible pneumothorax. 2. Right diaphragm flattening, possible COPD. Electronically Signed   By: Marnee Spring M.D.   On: 02/27/2016 21:46   Ct Chest W Contrast  Result Date: 02/28/2016 CLINICAL DATA:  Left hemothorax on x-ray. EXAM: CT CHEST WITH CONTRAST TECHNIQUE: Multidetector CT imaging of the chest was performed during intravenous contrast administration. CONTRAST:  75mL ISOVUE-300 IOPAMIDOL (ISOVUE-300) INJECTION 61% COMPARISON:  Chest x-ray 02/27/2016. FINDINGS: Cardiovascular: The heart size is normal. No pericardial effusion. Coronary artery calcification is noted. Atherosclerotic calcification is noted in the wall of the thoracic aorta. Mediastinum/Nodes: No mediastinal lymphadenopathy. There is no hilar lymphadenopathy. The esophagus has normal imaging features. There is no axillary lymphadenopathy. Lungs/Pleura: Moderate left-sided pleural fluid collection has varying attenuation, consistent with blood products. Loculated components are identified medially in fluid is seen tracking into  the major fissure. Patchy areas of ground-glass attenuation are seen in the lungs bilaterally with compressive atelectasis and/ or hemorrhage adjacent to the left pleural fluid collection. Changes may be related to areas of lung contusion given the history of fall. A 1.7 x 1.2 cm ground-glass nodule in the left upper lobe with associated cavitation, posttraumatic pneumatocele E all  is possible although this lesion will require follow-up. Upper Abdomen: Unremarkable. Musculoskeletal: Fractures of the posterior left sixth through tenth ribs are evident. Old lateral left eighth rib fracture is noted. IMPRESSION: 1. Moderate left-sided pleural fluid collection has varying areas of attenuation, consistent with hemothorax. 2. Scattered areas of ground-glass attenuation in the lungs bilaterally may reflect small airways disease or areas of lung contusion. 3. 17 mm mixed ground-glass attenuation/cavitary nodule in the left upper lobe. While this may be posttraumatic, neoplasm could also have this appearance. Follow-up CT chest without contrast in 3 months is recommended to reassess. 4. Acute fractures of the posterior left sixth through tenth ribs. Electronically Signed   By: Kennith CenterEric  Mansell M.D.   On: 02/28/2016 08:07    Assessment/Plan:   1 CT findings noted- to be reviewed by surgeon to plan intervention. 2 hyponatremia- receiving fluids, thiamine, will add MVI 3 will order home meds, on retrovirals for HIV  LOS: 0 days    GOLD,WAYNE E 02/28/2016

## 2016-02-28 NOTE — ED Provider Notes (Signed)
Patient sent here for evaluation of possible empyema. He has rib fractures that are approximately 252 weeks old. He is now having difficulty breathing and [facility] reveals a left hemothorax. The patient was sent here for cardiothoracic consultation. The patient has no complaints at present. Dr. Donata ClayVan Trigt from cardiothoracic surgery as evaluated and will admit the patient.   Geoffery Lyonsouglas Modine Oppenheimer, MD 02/28/16 [id number]

## 2016-02-28 NOTE — ED Notes (Signed)
Dr. VanTrigt at bedside 

## 2016-02-28 NOTE — Progress Notes (Signed)
@   08:00am   Received a call from Lab.  MRSA screen  Positive.   Patient placed on contact isolation.  Governor SpeckingKathryn Raymund Manrique, RN

## 2016-02-28 NOTE — Progress Notes (Signed)
  Subjective: Patient examined, recent chest x-rays personally reviewed and counseled with patient. CT scan of chest pending. Patient presents with left-sided chest pain and rib fractures of 89 and 10 with moderate left hemothorax and anemia. Patient states he fell on his left side 3 weeks ago against piece of furniture. He is hemodynamically stable with adequate room air saturation. He is tender on the left side with diminished breath sounds. Patient will be admitted for surgical treatment of his left pneumothorax.  Objective: Vital signs in last 24 hours: Temp:  [98.1 F (36.7 C)] 98.1 F (36.7 C) (10/14 2023) Pulse Rate:  [99-118] 100 (10/15 0245) Cardiac Rhythm: Normal sinus rhythm (10/15 0108) Resp:  [11-26] 24 (10/15 0245) BP: (99-120)/(63-91) 113/78 (10/15 0245) SpO2:  [94 %-100 %] 95 % (10/15 0245) Weight:  [145 lb (65.8 kg)] 145 lb (65.8 kg) (10/14 2023)  Hemodynamic parameters for last 24 hours:  stable  Intake/Output from previous day: No intake/output data recorded. Intake/Output this shift: No intake/output data recorded.  Chronically ill appearing middle-aged male Diminished breath sounds on left  Lab Results:  Recent Labs  02/27/16 2221  WBC 6.2  HGB 9.7*  HCT 27.5*  PLT 364   BMET:  Recent Labs  02/27/16 2221  NA 120*  K 3.7  CL 89*  CO2 20*  GLUCOSE 96  BUN 7  CREATININE 0.64  CALCIUM 8.1*    PT/INR:  Recent Labs  02/27/16 2221  LABPROT 17.8*  INR 1.45   ABG No results found for: PHART, HCO3, TCO2, ACIDBASEDEF, O2SAT CBG (last 3)  No results for input(s): GLUCAP in the last 72 hours.  Assessment/Plan: S/P  Left hemothorax Admit to telemetry for surgical therapy-drainage versus VATS in the next 48 hours.   LOS: 0 days    Kathlee Nationseter Van Trigt III 02/28/2016

## 2016-02-29 ENCOUNTER — Inpatient Hospital Stay (HOSPITAL_COMMUNITY): Payer: BLUE CROSS/BLUE SHIELD

## 2016-02-29 ENCOUNTER — Inpatient Hospital Stay: Payer: BLUE CROSS/BLUE SHIELD | Admitting: Family Medicine

## 2016-02-29 LAB — COMPREHENSIVE METABOLIC PANEL
ALT: 22 U/L (ref 17–63)
AST: 40 U/L (ref 15–41)
Albumin: 1.9 g/dL — ABNORMAL LOW (ref 3.5–5.0)
Alkaline Phosphatase: 137 U/L — ABNORMAL HIGH (ref 38–126)
Anion gap: 7 (ref 5–15)
BUN: 6 mg/dL (ref 6–20)
CO2: 20 mmol/L — ABNORMAL LOW (ref 22–32)
Calcium: 7.5 mg/dL — ABNORMAL LOW (ref 8.9–10.3)
Chloride: 101 mmol/L (ref 101–111)
Creatinine, Ser: 0.73 mg/dL (ref 0.61–1.24)
GFR calc Af Amer: >60 mL/min (ref >60)
GFR calc non Af Amer: >60 mL/min (ref >60)
Glucose, Bld: 112 mg/dL — ABNORMAL HIGH (ref 65–99)
Potassium: 3.5 mmol/L (ref 3.5–5.1)
Sodium: 128 mmol/L — ABNORMAL LOW (ref 135–145)
Total Bilirubin: 0.2 mg/dL — ABNORMAL LOW (ref 0.3–1.2)
Total Protein: 4.9 g/dL — ABNORMAL LOW (ref 6.5–8.1)

## 2016-02-29 LAB — PROTIME-INR
INR: 0.99
Prothrombin Time: 13.1 seconds (ref 11.4–15.2)

## 2016-02-29 LAB — BLOOD GAS, ARTERIAL
Acid-base deficit: 3.3 mmol/L — ABNORMAL HIGH (ref 0.0–2.0)
Bicarbonate: 19.9 mmol/L — ABNORMAL LOW (ref 20.0–28.0)
Drawn by: 46791
FIO2: 21
O2 Saturation: 94.4 %
Patient temperature: 98.6
pCO2 arterial: 28.2 mmHg — ABNORMAL LOW (ref 32.0–48.0)
pH, Arterial: 7.463 — ABNORMAL HIGH (ref 7.350–7.450)
pO2, Arterial: 73.6 mmHg — ABNORMAL LOW (ref 83.0–108.0)

## 2016-02-29 LAB — PREPARE RBC (CROSSMATCH)

## 2016-02-29 LAB — ABO/RH: ABO/RH(D): B POS

## 2016-02-29 LAB — HEMOGLOBIN A1C
Hgb A1c MFr Bld: 5 % (ref 4.8–5.6)
Mean Plasma Glucose: 97 mg/dL

## 2016-02-29 LAB — CBC
HCT: 22.5 % — ABNORMAL LOW (ref 39.0–52.0)
Hemoglobin: 7.7 g/dL — ABNORMAL LOW (ref 13.0–17.0)
MCH: 32.9 pg (ref 26.0–34.0)
MCHC: 34.2 g/dL (ref 30.0–36.0)
MCV: 96.2 fL (ref 78.0–100.0)
Platelets: 423 10*3/uL — ABNORMAL HIGH (ref 150–400)
RBC: 2.34 MIL/uL — ABNORMAL LOW (ref 4.22–5.81)
RDW: 12.4 % (ref 11.5–15.5)
WBC: 5.5 10*3/uL (ref 4.0–10.5)

## 2016-02-29 LAB — APTT: aPTT: 38 seconds — ABNORMAL HIGH (ref 24–36)

## 2016-02-29 MED ORDER — GUAIFENESIN ER 600 MG PO TB12
600.0000 mg | ORAL_TABLET | Freq: Two times a day (BID) | ORAL | Status: DC
  Administered 2016-02-29 (×2): 600 mg via ORAL
  Filled 2016-02-29 (×2): qty 1

## 2016-02-29 MED ORDER — VANCOMYCIN HCL IN DEXTROSE 1-5 GM/200ML-% IV SOLN
1000.0000 mg | Freq: Two times a day (BID) | INTRAVENOUS | Status: DC
  Administered 2016-02-29 – 2016-03-02 (×5): 1000 mg via INTRAVENOUS
  Filled 2016-02-29 (×8): qty 200

## 2016-02-29 MED ORDER — DEXTROSE 5 % IV SOLN: 2.0000 g | INTRAVENOUS | Status: DC

## 2016-02-29 MED FILL — Hydromorphone HCl Inj 2 MG/ML: INTRAMUSCULAR | Qty: 1 | Status: AC

## 2016-02-29 NOTE — Progress Notes (Addendum)
      [address]       [place] [id number]             [phone]       Subjective: Feels good this morning. Asking if its okay to shower.   Objective: Vital signs in last 24 hours: Temp:  [98.3 F (36.8 C)-98.6 F (37 C)] 98.6 F (37 C) (10/16 0528) Pulse Rate:  [97-103] 97 (10/16 0528) Cardiac Rhythm: Sinus tachycardia (10/15 2100) Resp:  [18] 18 (10/16 0528) BP: (111-117)/(70-71) 111/71 (10/16 0528) SpO2:  [96 %-100 %] 96 % (10/16 0528) Weight:  [143 lb 14.4 oz (65.3 kg)] 143 lb 14.4 oz (65.3 kg) (10/16 0500)     Intake/Output from previous day: 10/15 0701 - 10/16 0700 In: 2938.3 [P.O.:360; I.V.:2378.3] Out: 1500 [Urine:1500] Intake/Output this shift: No intake/output data recorded.  General appearance: alert, cooperative and no distress Heart: regular rate and rhythm, S1, S2 normal, no murmur, click, rub or gallop Lungs: clear to auscultation bilaterally Abdomen: soft, non-tender; bowel sounds normal; no masses,  no organomegaly Extremities: extremities normal, atraumatic, no cyanosis or edema Wound: n/a  Lab Results:  Recent Labs  02/27/16 2221  WBC 6.2  HGB 9.7*  HCT 27.5*  PLT 364   BMET:  Recent Labs  02/27/16 2221 02/28/16 0255  NA 120* 121*  K 3.7 3.7  CL 89* 94*  CO2 20* 17*  GLUCOSE 96 96  BUN 7 6  CREATININE 0.64 0.65  CALCIUM 8.1* 7.8*    PT/INR:  Recent Labs  02/28/16 0359  LABPROT 18.8*  INR 1.55   ABG No results found for: PHART, HCO3, TCO2, ACIDBASEDEF, O2SAT CBG (last 3)  No results for input(s): GLUCAP in the last 72 hours.  Assessment/Plan:  1 CT findings noted- to be reviewed by surgeon to plan intervention. 2 hyponatremia- receiving fluids, thiamine, MVI 3 On home meds, retrovirals for HIV  4. + MRSA and Staph aureus of the nares.   Plan: To the OR tomorrow per patient. Will order mucinex for secretions. Work on pain control.     LOS: 1 day    Sharlene Doryessa N Conte 02/29/2016  CT chest  personally reviewed and d/w partient He has a large Left hemothorax Will plan evacuation and decortication of L lower lobe tomorrow  patient examined and medical record reviewed,agree with above note. Kathlee Nationseter Van Trigt III 02/29/2016

## 2016-03-01 ENCOUNTER — Encounter (HOSPITAL_COMMUNITY)
Admission: EM | Disposition: A | Discharge status: Home / Self Care | Payer: Self-pay | Source: Home / Self Care | Attending: Cardiothoracic Surgery

## 2016-03-01 ENCOUNTER — Inpatient Hospital Stay (HOSPITAL_COMMUNITY): Payer: BLUE CROSS/BLUE SHIELD | Admitting: Certified Registered Nurse Anesthetist

## 2016-03-01 ENCOUNTER — Encounter (HOSPITAL_COMMUNITY): Payer: Self-pay | Admitting: Certified Registered Nurse Anesthetist

## 2016-03-01 ENCOUNTER — Inpatient Hospital Stay (HOSPITAL_COMMUNITY): Payer: BLUE CROSS/BLUE SHIELD

## 2016-03-01 DIAGNOSIS — Z9889 Other specified postprocedural states: Secondary | ICD-10-CM

## 2016-03-01 HISTORY — PX: PLEURAL EFFUSION DRAINAGE: SHX5099

## 2016-03-01 HISTORY — PX: VIDEO ASSISTED THORACOSCOPY: SHX5073

## 2016-03-01 LAB — CBC
HCT: 28.6 % — ABNORMAL LOW (ref 39.0–52.0)
Hemoglobin: 9.9 g/dL — ABNORMAL LOW (ref 13.0–17.0)
MCH: 31.6 pg (ref 26.0–34.0)
MCHC: 34.6 g/dL (ref 30.0–36.0)
MCV: 91.4 fL (ref 78.0–100.0)
Platelets: 398 10*3/uL (ref 150–400)
RBC: 3.13 MIL/uL — ABNORMAL LOW (ref 4.22–5.81)
RDW: 14 % (ref 11.5–15.5)
WBC: 8.2 10*3/uL (ref 4.0–10.5)

## 2016-03-01 LAB — PREPARE RBC (CROSSMATCH)

## 2016-03-01 LAB — BLOOD GAS, ARTERIAL
Acid-base deficit: 2.3 mmol/L — ABNORMAL HIGH (ref 0.0–2.0)
Bicarbonate: 21.5 mmol/L (ref 20.0–28.0)
Drawn by: 242311
O2 Content: 3 L/min
O2 Saturation: 96.9 %
Patient temperature: 98.6
pCO2 arterial: 34.3 mmHg (ref 32.0–48.0)
pH, Arterial: 7.414 (ref 7.350–7.450)
pO2, Arterial: 95 mmHg (ref 83.0–108.0)

## 2016-03-01 LAB — TYPE AND SCREEN
ABO/RH(D): B POS
Antibody Screen: NEGATIVE
Unit division: 0
Unit division: 0
Unit division: 0
Unit division: 0

## 2016-03-01 LAB — GLUCOSE, CAPILLARY
Glucose-Capillary: 113 mg/dL — ABNORMAL HIGH (ref 65–99)
Glucose-Capillary: 134 mg/dL — ABNORMAL HIGH (ref 65–99)
Glucose-Capillary: 87 mg/dL (ref 65–99)

## 2016-03-01 SURGERY — VIDEO ASSISTED THORACOSCOPY
Anesthesia: General | Site: Chest | Laterality: Left

## 2016-03-01 MED ORDER — SODIUM CHLORIDE 0.9 % IV SOLN: 10.0000 mL/h | Freq: Once | INTRAVENOUS | Status: DC

## 2016-03-01 MED ORDER — LIDOCAINE HCL (CARDIAC) 20 MG/ML IV SOLN
INTRAVENOUS | Status: DC | PRN
  Administered 2016-03-01: 80 mg via INTRAVENOUS

## 2016-03-01 MED ORDER — POTASSIUM CHLORIDE 10 MEQ/50ML IV SOLN
10.0000 meq | Freq: Every day | INTRAVENOUS | Status: DC | PRN
  Administered 2016-03-02 – 2016-03-03 (×6): 10 meq via INTRAVENOUS
  Filled 2016-03-01 (×7): qty 50

## 2016-03-01 MED ORDER — FENTANYL 40 MCG/ML IV SOLN
INTRAVENOUS | Status: DC
Start: 2016-03-01 — End: 2016-03-03
  Administered 2016-03-01: 70 ug via INTRAVENOUS
  Administered 2016-03-01: 280 ug via INTRAVENOUS
  Administered 2016-03-01: 3.25 ug via INTRAVENOUS
  Administered 2016-03-01: 14:00:00 via INTRAVENOUS
  Administered 2016-03-02: 230 ug via INTRAVENOUS
  Administered 2016-03-02: 160 ug via INTRAVENOUS
  Administered 2016-03-02: 200 ug via INTRAVENOUS
  Administered 2016-03-02: 210 ug via INTRAVENOUS
  Administered 2016-03-02: 300 ug via INTRAVENOUS
  Administered 2016-03-02: 06:00:00 via INTRAVENOUS
  Administered 2016-03-03: 80 ug via INTRAVENOUS
  Administered 2016-03-03: 40 ug via INTRAVENOUS
  Administered 2016-03-03: via INTRAVENOUS
  Administered 2016-03-03: 190 ug via INTRAVENOUS
  Administered 2016-03-03: 220 ug via INTRAVENOUS
  Filled 2016-03-01 (×2): qty 25

## 2016-03-01 MED ORDER — DIPHENHYDRAMINE HCL 12.5 MG/5ML PO ELIX: 12.5000 mg | ORAL_SOLUTION | Freq: Four times a day (QID) | ORAL | Status: DC | PRN

## 2016-03-01 MED ORDER — FENTANYL CITRATE (PF) 100 MCG/2ML IJ SOLN
INTRAMUSCULAR | Status: AC
  Administered 2016-03-01: 50 ug
  Filled 2016-03-01: qty 2

## 2016-03-01 MED ORDER — PROPOFOL 10 MG/ML IV BOLUS
INTRAVENOUS | Status: AC
  Filled 2016-03-01: qty 20

## 2016-03-01 MED ORDER — PHENYLEPHRINE HCL 10 MG/ML IJ SOLN
INTRAVENOUS | Status: DC | PRN
  Administered 2016-03-01: 50 ug/min via INTRAVENOUS

## 2016-03-01 MED ORDER — ROCURONIUM BROMIDE 100 MG/10ML IV SOLN
INTRAVENOUS | Status: DC | PRN
  Administered 2016-03-01 (×2): 10 mg via INTRAVENOUS
  Administered 2016-03-01: 50 mg via INTRAVENOUS

## 2016-03-01 MED ORDER — FENTANYL CITRATE (PF) 250 MCG/5ML IJ SOLN
INTRAMUSCULAR | Status: AC
  Filled 2016-03-01: qty 5

## 2016-03-01 MED ORDER — ALBUTEROL SULFATE (2.5 MG/3ML) 0.083% IN NEBU
2.5000 mg | INHALATION_SOLUTION | RESPIRATORY_TRACT | Status: DC
  Administered 2016-03-01: 2.5 mg via RESPIRATORY_TRACT
  Filled 2016-03-01: qty 3

## 2016-03-01 MED ORDER — MUPIROCIN 2 % EX OINT
1.0000 "application " | TOPICAL_OINTMENT | Freq: Two times a day (BID) | CUTANEOUS | Status: DC
  Administered 2016-03-01 – 2016-03-05 (×8): 1 via NASAL
  Filled 2016-03-01: qty 22

## 2016-03-01 MED ORDER — BUPIVACAINE 0.5 % ON-Q PUMP SINGLE CATH 400 ML
400.0000 mL | INJECTION | Status: AC
  Administered 2016-03-01: 400 mL
  Filled 2016-03-01: qty 400

## 2016-03-01 MED ORDER — LACTATED RINGERS IV SOLN
INTRAVENOUS | Status: DC | PRN
  Administered 2016-03-01: 11:00:00 via INTRAVENOUS

## 2016-03-01 MED ORDER — SODIUM CHLORIDE 0.9% FLUSH: 9.0000 mL | INTRAVENOUS | Status: DC | PRN

## 2016-03-01 MED ORDER — ACETAMINOPHEN 500 MG PO TABS
1000.0000 mg | ORAL_TABLET | Freq: Four times a day (QID) | ORAL | Status: DC
  Administered 2016-03-01 – 2016-03-05 (×12): 1000 mg via ORAL
  Filled 2016-03-01 (×13): qty 2

## 2016-03-01 MED ORDER — SENNOSIDES-DOCUSATE SODIUM 8.6-50 MG PO TABS
1.0000 | ORAL_TABLET | Freq: Every day | ORAL | Status: DC
  Administered 2016-03-02 – 2016-03-04 (×3): 1 via ORAL
  Filled 2016-03-01 (×3): qty 1

## 2016-03-01 MED ORDER — MIDAZOLAM HCL 2 MG/2ML IJ SOLN
INTRAMUSCULAR | Status: AC
  Filled 2016-03-01: qty 2

## 2016-03-01 MED ORDER — INSULIN ASPART 100 UNIT/ML ~~LOC~~ SOLN
0.0000 [IU] | SUBCUTANEOUS | Status: DC
Start: 2016-03-01 — End: 2016-03-03
  Administered 2016-03-01 – 2016-03-02 (×4): 2 [IU] via SUBCUTANEOUS

## 2016-03-01 MED ORDER — 0.9 % SODIUM CHLORIDE (POUR BTL) OPTIME
TOPICAL | Status: DC | PRN
  Administered 2016-03-01: 2000 mL

## 2016-03-01 MED ORDER — DEXTROSE 5 % IV SOLN
INTRAVENOUS | Status: DC | PRN
  Administered 2016-03-01: 1 g via INTRAVENOUS

## 2016-03-01 MED ORDER — ONDANSETRON HCL 4 MG/2ML IJ SOLN
INTRAMUSCULAR | Status: DC | PRN
  Administered 2016-03-01: 4 mg via INTRAVENOUS

## 2016-03-01 MED ORDER — VITAMIN B-1 100 MG PO TABS
100.0000 mg | ORAL_TABLET | Freq: Every day | ORAL | Status: DC
  Administered 2016-03-02 – 2016-03-05 (×4): 100 mg via ORAL
  Filled 2016-03-01 (×4): qty 1

## 2016-03-01 MED ORDER — OXYCODONE HCL 5 MG PO TABS
5.0000 mg | ORAL_TABLET | ORAL | Status: DC | PRN
  Administered 2016-03-01 – 2016-03-05 (×15): 10 mg via ORAL
  Filled 2016-03-01 (×15): qty 2

## 2016-03-01 MED ORDER — BUPIVACAINE ON-Q PAIN PUMP (FOR ORDER SET NO CHG)
INJECTION | Status: AC
Start: 2016-03-01 — End: 2016-03-04
  Filled 2016-03-01: qty 1

## 2016-03-01 MED ORDER — ACETAMINOPHEN 160 MG/5ML PO SOLN
1000.0000 mg | Freq: Four times a day (QID) | ORAL | Status: DC
  Administered 2016-03-05: 1000 mg via ORAL
  Filled 2016-03-01: qty 40.6

## 2016-03-01 MED ORDER — TRAMADOL HCL 50 MG PO TABS: 50.0000 mg | ORAL_TABLET | Freq: Four times a day (QID) | ORAL | Status: DC | PRN

## 2016-03-01 MED ORDER — DEXTROSE 5 % IV SOLN
INTRAVENOUS | Status: AC
  Filled 2016-03-01: qty 1

## 2016-03-01 MED ORDER — BISACODYL 5 MG PO TBEC
10.0000 mg | DELAYED_RELEASE_TABLET | Freq: Every day | ORAL | Status: DC
  Administered 2016-03-04: 10 mg via ORAL
  Filled 2016-03-01 (×3): qty 2

## 2016-03-01 MED ORDER — BUPIVACAINE HCL 0.5 % IJ SOLN
INTRAMUSCULAR | Status: DC | PRN
  Administered 2016-03-01: 5 mL

## 2016-03-01 MED ORDER — CHLORHEXIDINE GLUCONATE CLOTH 2 % EX PADS
6.0000 | MEDICATED_PAD | Freq: Every day | CUTANEOUS | Status: DC
  Administered 2016-03-02 – 2016-03-05 (×3): 6 via TOPICAL

## 2016-03-01 MED ORDER — PROPOFOL 10 MG/ML IV BOLUS
INTRAVENOUS | Status: DC | PRN
  Administered 2016-03-01: 180 mg via INTRAVENOUS
  Administered 2016-03-01: 50 mg via INTRAVENOUS

## 2016-03-01 MED ORDER — LACTATED RINGERS IV SOLN
INTRAVENOUS | Status: DC
  Administered 2016-03-01: 11:00:00 via INTRAVENOUS

## 2016-03-01 MED ORDER — DEXTROSE-NACL 5-0.45 % IV SOLN
INTRAVENOUS | Status: DC
  Administered 2016-03-01: 100 mL/h via INTRAVENOUS
  Administered 2016-03-02: 03:00:00 via INTRAVENOUS

## 2016-03-01 MED ORDER — VENLAFAXINE HCL 50 MG PO TABS
50.0000 mg | ORAL_TABLET | Freq: Every day | ORAL | Status: DC
  Administered 2016-03-03 – 2016-03-04 (×2): 50 mg via ORAL
  Filled 2016-03-01 (×5): qty 1

## 2016-03-01 MED ORDER — SUGAMMADEX SODIUM 200 MG/2ML IV SOLN
INTRAVENOUS | Status: DC | PRN
  Administered 2016-03-01: 150 mg via INTRAVENOUS

## 2016-03-01 MED ORDER — MUPIROCIN 2 % EX OINT
TOPICAL_OINTMENT | CUTANEOUS | Status: AC
  Administered 2016-03-01: 1
  Filled 2016-03-01: qty 22

## 2016-03-01 MED ORDER — ARTIFICIAL TEARS OP OINT
TOPICAL_OINTMENT | OPHTHALMIC | Status: DC | PRN
  Administered 2016-03-01: 1 via OPHTHALMIC

## 2016-03-01 MED ORDER — EFAVIRENZ-EMTRICITAB-TENOFOVIR 600-200-300 MG PO TABS
1.0000 | ORAL_TABLET | Freq: Every day | ORAL | Status: DC
  Administered 2016-03-01 – 2016-03-04 (×4): 1 via ORAL
  Filled 2016-03-01 (×4): qty 1

## 2016-03-01 MED ORDER — SULFAMETHOXAZOLE-TRIMETHOPRIM 800-160 MG PO TABS
1.0000 | ORAL_TABLET | Freq: Every day | ORAL | Status: DC
  Administered 2016-03-02 – 2016-03-05 (×4): 1 via ORAL
  Filled 2016-03-01 (×4): qty 1

## 2016-03-01 MED ORDER — ALBUTEROL SULFATE (2.5 MG/3ML) 0.083% IN NEBU: 2.5000 mg | INHALATION_SOLUTION | RESPIRATORY_TRACT | Status: DC

## 2016-03-01 MED ORDER — FENTANYL 40 MCG/ML IV SOLN
INTRAVENOUS | Status: AC
  Filled 2016-03-01: qty 25

## 2016-03-01 MED ORDER — FENTANYL CITRATE (PF) 100 MCG/2ML IJ SOLN: 25.0000 ug | INTRAMUSCULAR | Status: DC | PRN

## 2016-03-01 MED ORDER — ALBUTEROL SULFATE (2.5 MG/3ML) 0.083% IN NEBU
2.5000 mg | INHALATION_SOLUTION | Freq: Two times a day (BID) | RESPIRATORY_TRACT | Status: DC
  Administered 2016-03-02 – 2016-03-05 (×6): 2.5 mg via RESPIRATORY_TRACT
  Filled 2016-03-01 (×5): qty 3

## 2016-03-01 MED ORDER — DIPHENHYDRAMINE HCL 50 MG/ML IJ SOLN: 12.5000 mg | Freq: Four times a day (QID) | INTRAMUSCULAR | Status: DC | PRN

## 2016-03-01 MED ORDER — BUPIVACAINE HCL (PF) 0.5 % IJ SOLN
INTRAMUSCULAR | Status: AC
  Filled 2016-03-01: qty 10

## 2016-03-01 MED ORDER — MIDAZOLAM HCL 2 MG/2ML IJ SOLN
INTRAMUSCULAR | Status: AC
  Administered 2016-03-01: 1 mg
  Filled 2016-03-01: qty 2

## 2016-03-01 MED ORDER — FENTANYL CITRATE (PF) 100 MCG/2ML IJ SOLN
INTRAMUSCULAR | Status: DC | PRN
  Administered 2016-03-01 (×5): 50 ug via INTRAVENOUS

## 2016-03-01 MED ORDER — FENTANYL CITRATE (PF) 100 MCG/2ML IJ SOLN
50.0000 ug | Freq: Once | INTRAMUSCULAR | Status: AC
  Administered 2016-03-01: 50 ug via INTRAVENOUS
  Filled 2016-03-01: qty 1

## 2016-03-01 MED ORDER — NALOXONE HCL 0.4 MG/ML IJ SOLN: 0.4000 mg | INTRAMUSCULAR | Status: DC | PRN

## 2016-03-01 MED ORDER — ONDANSETRON HCL 4 MG/2ML IJ SOLN
4.0000 mg | Freq: Four times a day (QID) | INTRAMUSCULAR | Status: DC | PRN
Start: 2016-03-01 — End: 2016-03-03

## 2016-03-01 SURGICAL SUPPLY — 62 items
BAG DECANTER FOR FLEXI CONT (MISCELLANEOUS) IMPLANT
BLADE SURG 11 STRL SS (BLADE) ×4 IMPLANT
CANISTER SUCTION 2500CC (MISCELLANEOUS) ×4 IMPLANT
CATH KIT ON Q 5IN SLV (PAIN MANAGEMENT) IMPLANT
CATH KIT ON-Q SILVERSOAK 5IN (CATHETERS) ×4 IMPLANT
CATH ROBINSON RED A/P 22FR (CATHETERS) IMPLANT
CATH THORACIC 28FR (CATHETERS) IMPLANT
CATH THORACIC 36FR (CATHETERS) IMPLANT
CATH THORACIC 36FR RT ANG (CATHETERS) ×4 IMPLANT
CONT SPEC 4OZ CLIKSEAL STRL BL (MISCELLANEOUS) ×8 IMPLANT
COVER SURGICAL LIGHT HANDLE (MISCELLANEOUS) IMPLANT
DERMABOND ADVANCED (GAUZE/BANDAGES/DRESSINGS)
DERMABOND ADVANCED .7 DNX12 (GAUZE/BANDAGES/DRESSINGS) IMPLANT
DRAIN CHANNEL 32F RND 10.7 FF (WOUND CARE) ×4 IMPLANT
DRAPE LAPAROSCOPIC ABDOMINAL (DRAPES) ×4 IMPLANT
DRAPE SLUSH/WARMER DISC (DRAPES) ×4 IMPLANT
DRAPE WARM FLUID 44X44 (DRAPE) IMPLANT
ELECT BLADE 6.5 EXT (BLADE) ×4 IMPLANT
ELECT REM PT RETURN 9FT ADLT (ELECTROSURGICAL) ×4
ELECTRODE REM PT RTRN 9FT ADLT (ELECTROSURGICAL) ×2 IMPLANT
GAUZE SPONGE 4X4 12PLY STRL (GAUZE/BANDAGES/DRESSINGS) ×4 IMPLANT
GLOVE BIO SURGEON STRL SZ7.5 (GLOVE) ×8 IMPLANT
GOWN STRL REUS W/ TWL LRG LVL3 (GOWN DISPOSABLE) ×6 IMPLANT
GOWN STRL REUS W/TWL LRG LVL3 (GOWN DISPOSABLE) ×6
KIT BASIN OR (CUSTOM PROCEDURE TRAY) ×4 IMPLANT
KIT ROOM TURNOVER OR (KITS) ×4 IMPLANT
KIT SUCTION CATH 14FR (SUCTIONS) ×4 IMPLANT
NS IRRIG 1000ML POUR BTL (IV SOLUTION) ×8 IMPLANT
PACK CHEST (CUSTOM PROCEDURE TRAY) ×4 IMPLANT
PAD ARMBOARD 7.5X6 YLW CONV (MISCELLANEOUS) ×8 IMPLANT
SEALANT SURG COSEAL 4ML (VASCULAR PRODUCTS) IMPLANT
SOLUTION ANTI FOG 6CC (MISCELLANEOUS) ×4 IMPLANT
SPONGE GAUZE 4X4 12PLY STER LF (GAUZE/BANDAGES/DRESSINGS) ×4 IMPLANT
SPONGE TONSIL 1 RF SGL (DISPOSABLE) ×8 IMPLANT
SUT CHROMIC 3 0 SH 27 (SUTURE) IMPLANT
SUT ETHILON 3 0 PS 1 (SUTURE) IMPLANT
SUT PROLENE 3 0 SH DA (SUTURE) IMPLANT
SUT PROLENE 4 0 RB 1 (SUTURE)
SUT PROLENE 4-0 RB1 .5 CRCL 36 (SUTURE) IMPLANT
SUT SILK  1 MH (SUTURE) ×4
SUT SILK 1 MH (SUTURE) ×4 IMPLANT
SUT SILK 2 0SH CR/8 30 (SUTURE) IMPLANT
SUT SILK 3 0SH CR/8 30 (SUTURE) IMPLANT
SUT VIC AB 1 CTX 18 (SUTURE) ×12 IMPLANT
SUT VIC AB 2 TP1 27 (SUTURE) IMPLANT
SUT VIC AB 2-0 CT2 18 VCP726D (SUTURE) IMPLANT
SUT VIC AB 2-0 CTX 36 (SUTURE) ×8 IMPLANT
SUT VIC AB 3-0 SH 18 (SUTURE) IMPLANT
SUT VIC AB 3-0 X1 27 (SUTURE) ×4 IMPLANT
SUT VICRYL 0 UR6 27IN ABS (SUTURE) IMPLANT
SUT VICRYL 2 TP 1 (SUTURE) ×4 IMPLANT
SWAB COLLECTION DEVICE MRSA (MISCELLANEOUS) ×4 IMPLANT
SYSTEM SAHARA CHEST DRAIN ATS (WOUND CARE) ×4 IMPLANT
TAPE CLOTH SURG 4X10 WHT LF (GAUZE/BANDAGES/DRESSINGS) ×4 IMPLANT
TIP APPLICATOR SPRAY EXTEND 16 (VASCULAR PRODUCTS) IMPLANT
TOWEL OR 17X24 6PK STRL BLUE (TOWEL DISPOSABLE) ×4 IMPLANT
TOWEL OR 17X26 10 PK STRL BLUE (TOWEL DISPOSABLE) ×8 IMPLANT
TRAP SPECIMEN MUCOUS 40CC (MISCELLANEOUS) ×8 IMPLANT
TRAY FOLEY CATH 16FRSI W/METER (SET/KITS/TRAYS/PACK) ×4 IMPLANT
TUBE ANAEROBIC SPECIMEN COL (MISCELLANEOUS) ×4 IMPLANT
TUNNELER SHEATH ON-Q 11GX8 DSP (PAIN MANAGEMENT) ×4 IMPLANT
WATER STERILE IRR 1000ML POUR (IV SOLUTION) ×8 IMPLANT

## 2016-03-01 NOTE — Telephone Encounter (Signed)
Left message for pt just calling to see how he was doing per JCL to please give me a call back

## 2016-03-01 NOTE — H&P (Signed)
Subjective:   Patient is a 55 y.o. male with a past medical history of tobacco abuse, GERD, ADHD, HIV positive, and anxiety/depression who presented to the ED with several broken ribs and shortness of breath. He shares that he got up to go to the bathroom at home and tripped and fell into a sharp edge. He knew that he had some broken ribs on the left side, and called his physician who suggested going to the emergency room. He arrive hemodynamically stable. He received a CT of the chest which showed several fractured ribs and moderate left hemothorax. He is tender on the left side and has diminished breath sounds. He was evaluated by Dr. Donata ClayVan Trigt and scheduled for a VATs. The patient agreed to the procedure.    Patient Active Problem List   Diagnosis Date Noted  . Hemothorax on left 02/28/2016  . Smoker 12/16/2014  . Encounter for long-term (current) use of other high-risk medications 12/16/2014  . Gastroesophageal reflux disease without esophagitis 12/16/2014  . Ulnar neuropathy at elbow of left upper extremity 10/01/2014  . ADHD (attention deficit hyperactivity disorder), predominantly hyperactive impulsive type 07/31/2013  . S/P ACL repair 12/07/2010  . HIV positive (HCC) 11/18/2010  . Anxiety and depression 11/18/2010   Past Medical History:  Diagnosis Date  . GERD (gastroesophageal reflux disease)   . Hemorrhoids   . HIV infection (HCC)   . Smoker   . Ulnar neuropathy at elbow of left upper extremity 10/01/2014    Past Surgical History:  Procedure Laterality Date  . COLONOSCOPY  2008  . KNEE ARTHROSCOPY WITH ANTERIOR CRUCIATE LIGAMENT (ACL) REPAIR Right 09/2011    Prescriptions Prior to Admission  Medication Sig Dispense Refill Last Dose  . efavirenz-emtricitabine-tenofovir (ATRIPLA) 600-200-300 MG tablet Take 1 tablet by mouth at bedtime. 30 tablet 11 02/26/2016 at Unknown time  . Melatonin 10 MG TABS Take 1 tablet by mouth at bedtime.    02/26/2016 at Unknown time  . Multiple  Vitamin (MULTIVITAMIN WITH MINERALS) TABS tablet Take 1 tablet by mouth daily.   Past Week at Unknown time  . naproxen sodium (ALEVE) 220 MG tablet Take 220 mg by mouth daily as needed (for pain).    unknown  . omeprazole (PRILOSEC) 20 MG capsule Take 1 capsule (20 mg total) by mouth daily. 90 capsule 3 02/27/2016 at Unknown time  . sulfamethoxazole-trimethoprim (BACTRIM DS,SEPTRA DS) 800-160 MG tablet Take 1 tablet by mouth daily. 90 tablet 3 02/27/2016 at Unknown time  . venlafaxine (EFFEXOR) 100 MG tablet TAKE 1 TABLET (100 MG TOTAL) BY MOUTH 2 (TWO) TIMES DAILY. (Patient taking differently: Take 50 mg by mouth daily. ) 180 tablet 1 02/27/2016 at Unknown time  . methylphenidate (RITALIN) 10 MG tablet Take 1 tablet (10 mg total) by mouth 2 (two) times daily. (Patient not taking: Reported on 10/07/2015) 60 tablet 0 Not Taking    Data Review:  H & H 7.7/22.5 MRSA + in the nares. Contact precautions.   Physical Exam:  Chronically ill appearing middle-aged male Diminished breath sounds on left Heart in RRR without murmur  Plan: Admit to telemetry unit for surgical therapy. Transfuse 2 units of pRBC for acute blood loss anemia.  VATS scheduled for 03/01/2016. All risks and benefits of the procedure discussed with the patient. The patient agreed to proceed with surgery. All questions were answered to the patients satisfaction.

## 2016-03-01 NOTE — Brief Op Note (Signed)
02/27/2016 - 03/01/2016  1:36 PM  PATIENT:  [name] [name]  55 y.o. male  PRE-OPERATIVE DIAGNOSIS:  LEFT HEMOTHORAX  POST-OPERATIVE DIAGNOSIS:  LEFT HEMOTHORAX  PROCEDURE:  Procedure(s): VIDEO ASSISTED THORACOSCOPY (Left) DRAINAGE OF HEMOTHORAX (Left)  SURGEON:  Surgeon(s) and Role:    * Kerin PernaPeter Van Trigt, MD - Primary  PHYSICIAN ASSISTANT:  Jari Favreessa Conte, PA-C   ANESTHESIA:   general  EBL:  Total I/O In: 1670 [I.V.:1000; Blood:670] Out: 600 [Urine:400; Blood:200]  BLOOD ADMINISTERED:none  DRAINS: routine   LOCAL MEDICATIONS USED:  NONE  SPECIMEN:  No Specimen and Source of Specimen:  pleural peelings  DISPOSITION OF SPECIMEN:  PATHOLOGY  COUNTS:  YES  TOURNIQUET:  * No tourniquets in log *  DICTATION: .Other Dictation: Dictation Number pending  PLAN OF CARE: Admit to inpatient   PATIENT DISPOSITION:  ICU - extubated and stable.   Delay start of Pharmacological VTE agent (>24hrs) due to surgical blood loss or risk of bleeding: yes

## 2016-03-01 NOTE — Progress Notes (Signed)
The patient was examined and preop studies reviewed. There has been no change from the prior exam and the patient is ready for surgery.\ Plan Left VATS and drainage of hemothorax on D [place]

## 2016-03-01 NOTE — Anesthesia Postprocedure Evaluation (Signed)
Anesthesia Post Note  Patient: [name] [name]  Procedure(s) Performed: Procedure(s) (LRB): VIDEO ASSISTED THORACOSCOPY (Left) DRAINAGE OF HEMOTHORAX (Left)  Patient location during evaluation: PACU Anesthesia Type: General Level of consciousness: awake and alert Pain management: pain level controlled Vital Signs Assessment: post-procedure vital signs reviewed and stable Respiratory status: spontaneous breathing, nonlabored ventilation, respiratory function stable and patient connected to nasal cannula oxygen Cardiovascular status: blood pressure returned to baseline and stable Postop Assessment: no signs of nausea or vomiting Anesthetic complications: no    Last Vitals:  Vitals:   03/01/16 1418 03/01/16 1433  BP: 129/76 125/76  Pulse: 99 89  Resp: 20 18  Temp:      Last Pain:  Vitals:   03/01/16 1433  TempSrc:   PainSc: Asleep                 Bonita Quinichard S Shalea Tomczak

## 2016-03-01 NOTE — Anesthesia Preprocedure Evaluation (Addendum)
Anesthesia Evaluation    Airway Mallampati: I  TM Distance: >3 FB Neck ROM: Full    Dental  (+) Poor Dentition   Pulmonary shortness of breath, Current Smoker,     + decreased breath sounds      Cardiovascular Normal cardiovascular exam     Neuro/Psych    GI/Hepatic Neg liver ROS, GERD  Medicated,  Endo/Other  negative endocrine ROS  Renal/GU Hyponatremia, Na 128, improving.      Musculoskeletal Multiple rib fractures   Abdominal   Peds  Hematology  (+) HIV,   Anesthesia Other Findings Left sided hemothorax  Reproductive/Obstetrics                            Anesthesia Physical Anesthesia Plan  ASA: III  Anesthesia Plan: General   Post-op Pain Management:    Induction: Intravenous  Airway Management Planned: Double Lumen EBT  Additional Equipment: Arterial line and CVP  Intra-op Plan:   Post-operative Plan: Extubation in OR  Informed Consent: I have reviewed the patients History and Physical, chart, labs and discussed the procedure including the risks, benefits and alternatives for the proposed anesthesia with the patient or authorized representative who has indicated his/her understanding and acceptance.   Dental advisory given  Plan Discussed with: CRNA, Anesthesiologist and Surgeon  Anesthesia Plan Comments:         Anesthesia Quick Evaluation

## 2016-03-01 NOTE — Transfer of Care (Signed)
Immediate Anesthesia Transfer of Care Note  Patient: [name] [name]  Procedure(s) Performed: Procedure(s): VIDEO ASSISTED THORACOSCOPY (Left) DRAINAGE OF HEMOTHORAX (Left)  Patient Location: PACU  Anesthesia Type:General  Level of Consciousness: awake, alert , oriented and patient cooperative  Airway & Oxygen Therapy: Patient Spontanous Breathing and Patient connected to face mask oxygen  Post-op Assessment: Report given to RN and Post -op Vital signs reviewed and stable  Post vital signs: Reviewed and stable  Last Vitals:  Vitals:   03/01/16 1010 03/01/16 1020  BP: 123/70 117/74  Pulse:  96  Resp:  16  Temp:      Last Pain:  Vitals:   03/01/16 0955  TempSrc:   PainSc: 10-Worst pain ever         Complications: No apparent anesthesia complications

## 2016-03-01 NOTE — Anesthesia Procedure Notes (Signed)
Central Venous Catheter Insertion Performed by: anesthesiologist 03/01/2016 10:31 AM Patient location: Pre-op. Preanesthetic checklist: patient identified, IV checked, site marked, risks and benefits discussed, surgical consent, monitors and equipment checked, pre-op evaluation, timeout performed and anesthesia consent Lidocaine 1% used for infiltration Landmarks identified Catheter size: 8 Fr Central line was placed.Double lumen Procedure performed using ultrasound guided technique. Attempts: 1 Following insertion, dressing applied and line sutured. Post procedure assessment: blood return through all ports. Patient tolerated the procedure well with no immediate complications.

## 2016-03-01 NOTE — Anesthesia Procedure Notes (Signed)
Procedure Name: Intubation Date/Time: 03/01/2016 11:30 AM Performed by: Adonis HousekeeperNGELL, Dekisha Mesmer M Pre-anesthesia Checklist: Patient identified, Emergency Drugs available, Suction available and Patient being monitored Patient Re-evaluated:Patient Re-evaluated prior to inductionOxygen Delivery Method: Circle system utilized Preoxygenation: Pre-oxygenation with 100% oxygen Intubation Type: IV induction Ventilation: Mask ventilation without difficulty and Oral airway inserted - appropriate to patient size Laryngoscope Size: Glidescope and 4 Grade View: Grade I Tube type: Oral Endobronchial tube: Left and 37 Fr Number of attempts: 1 Airway Equipment and Method: Stylet Placement Confirmation: ETT inserted through vocal cords under direct vision,  positive ETCO2 and breath sounds checked- equal and bilateral Secured at: 21 cm Tube secured with: Tape Dental Injury: Teeth and Oropharynx as per pre-operative assessment

## 2016-03-02 ENCOUNTER — Encounter (HOSPITAL_COMMUNITY): Payer: Self-pay | Admitting: Cardiothoracic Surgery

## 2016-03-02 ENCOUNTER — Inpatient Hospital Stay (HOSPITAL_COMMUNITY): Payer: BLUE CROSS/BLUE SHIELD

## 2016-03-02 LAB — CBC
HCT: 28.4 % — ABNORMAL LOW (ref 39.0–52.0)
HEMOGLOBIN: 9.8 g/dL — AB (ref 13.0–17.0)
MCH: 31.6 pg (ref 26.0–34.0)
MCHC: 34.5 g/dL (ref 30.0–36.0)
MCV: 91.6 fL (ref 78.0–100.0)
PLATELETS: 389 10*3/uL (ref 150–400)
RBC: 3.1 MIL/uL — AB (ref 4.22–5.81)
RDW: 15.2 % (ref 11.5–15.5)
WBC: 6 10*3/uL (ref 4.0–10.5)

## 2016-03-02 LAB — POCT I-STAT 3, ART BLOOD GAS (G3+)
Acid-base deficit: 2 mmol/L (ref 0.0–2.0)
Bicarbonate: 21.5 mmol/L (ref 20.0–28.0)
O2 Saturation: 95 %
Patient temperature: 98.7
TCO2: 22 mmol/L (ref 0–100)
pCO2 arterial: 32.4 mmHg (ref 32.0–48.0)
pH, Arterial: 7.43 (ref 7.350–7.450)
pO2, Arterial: 73 mmHg — ABNORMAL LOW (ref 83.0–108.0)

## 2016-03-02 LAB — TYPE AND SCREEN
ABO/RH(D): B POS
ANTIBODY SCREEN: NEGATIVE
Unit division: 0
Unit division: 0

## 2016-03-02 LAB — GLUCOSE, CAPILLARY
Glucose-Capillary: 120 mg/dL — ABNORMAL HIGH (ref 65–99)
Glucose-Capillary: 124 mg/dL — ABNORMAL HIGH (ref 65–99)
Glucose-Capillary: 160 mg/dL — ABNORMAL HIGH (ref 65–99)
Glucose-Capillary: 50 mg/dL — ABNORMAL LOW (ref 65–99)
Glucose-Capillary: 62 mg/dL — ABNORMAL LOW (ref 65–99)
Glucose-Capillary: 69 mg/dL (ref 65–99)
Glucose-Capillary: 80 mg/dL (ref 65–99)
Glucose-Capillary: 94 mg/dL (ref 65–99)

## 2016-03-02 LAB — BASIC METABOLIC PANEL
Anion gap: 6 (ref 5–15)
CHLORIDE: 99 mmol/L — AB (ref 101–111)
CO2: 22 mmol/L (ref 22–32)
Calcium: 7.6 mg/dL — ABNORMAL LOW (ref 8.9–10.3)
Creatinine, Ser: 0.48 mg/dL — ABNORMAL LOW (ref 0.61–1.24)
GFR calc Af Amer: >60 mL/min (ref >60)
GFR calc non Af Amer: >60 mL/min (ref >60)
GLUCOSE: 107 mg/dL — AB (ref 65–99)
POTASSIUM: 3.4 mmol/L — AB (ref 3.5–5.1)
Sodium: 127 mmol/L — ABNORMAL LOW (ref 135–145)

## 2016-03-02 MED ORDER — FE FUMARATE-B12-VIT C-FA-IFC PO CAPS
1.0000 | ORAL_CAPSULE | Freq: Three times a day (TID) | ORAL | Status: DC
  Administered 2016-03-02 – 2016-03-05 (×10): 1 via ORAL
  Filled 2016-03-02 (×10): qty 1

## 2016-03-02 MED ORDER — CALCIUM CARBONATE ANTACID 500 MG PO CHEW
1.0000 | CHEWABLE_TABLET | Freq: Three times a day (TID) | ORAL | Status: DC
  Administered 2016-03-02: 200 mg via ORAL
  Filled 2016-03-02: qty 1

## 2016-03-02 MED ORDER — SODIUM CHLORIDE 0.9% FLUSH
10.0000 mL | INTRAVENOUS | Status: DC | PRN
  Administered 2016-03-04 – 2016-03-05 (×3): 20 mL
  Filled 2016-03-02 (×3): qty 40

## 2016-03-02 MED ORDER — FUROSEMIDE 10 MG/ML IJ SOLN
20.0000 mg | Freq: Every day | INTRAMUSCULAR | Status: DC
  Administered 2016-03-02: 20 mg via INTRAVENOUS
  Filled 2016-03-02: qty 2

## 2016-03-02 MED ORDER — CALCIUM CARBONATE ANTACID 500 MG PO CHEW: 1.0000 | CHEWABLE_TABLET | Freq: Three times a day (TID) | ORAL | Status: DC | PRN

## 2016-03-02 MED ORDER — SODIUM CHLORIDE 0.9% FLUSH
10.0000 mL | Freq: Two times a day (BID) | INTRAVENOUS | Status: DC
  Administered 2016-03-02 – 2016-03-03 (×4): 10 mL

## 2016-03-02 MED ORDER — PANTOPRAZOLE SODIUM 40 MG PO TBEC
40.0000 mg | DELAYED_RELEASE_TABLET | Freq: Every day | ORAL | Status: DC
  Administered 2016-03-02 – 2016-03-05 (×4): 40 mg via ORAL
  Filled 2016-03-02 (×4): qty 1

## 2016-03-02 NOTE — Progress Notes (Signed)
Patient ID: [name] ElseDarrell L Seiber, male   DOB: Oct 05, 1960, 55 y.o.   MRN: [id number] EVENING ROUNDS NOTE :     [address]       [place] [id number]             [phone]                 1 Day Post-Op Procedure(s) (LRB): VIDEO ASSISTED THORACOSCOPY (Left) DRAINAGE OF HEMOTHORAX (Left)  Total Length of Stay:  LOS: 3 days  BP 116/80   Pulse (!) 129   Temp 98.1 F (36.7 C) (Oral)   Resp (!) 23   Ht 5\' 6"  (1.676 m)   Wt 147 lb 7.8 oz (66.9 kg)   SpO2 98%   BMI 23.81 kg/m   .Intake/Output      10/18 0701 - 10/19 0700   P.O. 480   I.V. (mL/kg) 480 (7.2)   Blood    Other    IV Piggyback 200   Total Intake(mL/kg) 1160 (17.3)   Urine (mL/kg/hr) 2005 (2.3)   Blood    Chest Tube 70 (0.1)   Total Output 2075   Net -915         . bupivacaine ON-Q pain pump    . dextrose 5 % and 0.45% NaCl 10 mL/hr at 03/02/16 1100     Lab Results  Component Value Date   WBC 6.0 03/02/2016   HGB 9.8 (L) 03/02/2016   HCT 28.4 (L) 03/02/2016   PLT 389 03/02/2016   GLUCOSE 107 (H) 03/02/2016   CHOL 211 (H) 10/07/2015   TRIG 130 10/07/2015   HDL 99 10/07/2015   LDLCALC 86 10/07/2015   ALT 22 02/29/2016   AST 40 02/29/2016   NA 127 (L) 03/02/2016   K 3.4 (L) 03/02/2016   CL 99 (L) 03/02/2016   CREATININE 0.48 (L) 03/02/2016   BUN <5 (L) 03/02/2016   CO2 22 03/02/2016   INR 0.99 02/29/2016   HGBA1C 5.0 02/28/2016     Delight OvensEdward B Anahi Belmar MD  Beeper [phone] Office [phone] 03/02/2016 7:53 PM

## 2016-03-02 NOTE — Progress Notes (Signed)
Hypoglycemic Event  CBG: 62 (capillary sample)  Treatment: 15 GM carbohydrate snack  Symptoms: None  Follow-up CBG: Time: 0012 CBG Result: 114 (venous sample)  Possible Reasons for Event: Medication regimen: pt on q4 SSI, not diabetic and Other: low BG may've been r/t sample site  Comments/MD notified: Will discuss medication regimen in AM with MD. Will check BG from venous sample, not capillary.    Peyton Bottomsachel R Lio Wehrly, RN

## 2016-03-02 NOTE — Progress Notes (Signed)
1 Day Post-Op Procedure(s) (LRB): VIDEO ASSISTED THORACOSCOPY (Left) DRAINAGE OF HEMOTHORAX (Left) Subjective: L hemothorax CXR with l base atekectasis HB stableObjective: Vital signs in last 24 hours: Temp:  [97.5 F (36.4 C)-98.7 F (37.1 C)] 97.5 F (36.4 C) (10/18 0800) Pulse Rate:  [83-146] 99 (10/18 0900) Cardiac Rhythm: Normal sinus rhythm (10/18 0400) Resp:  [13-31] 18 (10/18 1000) BP: (93-133)/(62-89) 125/81 (10/18 0700) SpO2:  [95 %-100 %] 100 % (10/18 0900) Arterial Line BP: (69-177)/(1-86) 155/77 (10/18 1000) Weight:  [147 lb 7.8 oz (66.9 kg)] 147 lb 7.8 oz (66.9 kg) (10/18 0500)  Hemodynamic parameters for last 24 hours:   stableIntake/Output from previous day: 10/17 0701 - 10/18 0700 In: 3525.3 [I.V.:2605.3; Blood:670; IV Piggyback:200] Out: 2790 [Urine:2280; Blood:200; Chest Tube:310] Intake/Output this shift: Total I/O In: 500 [I.V.:300; IV Piggyback:200] Out: 155 [Urine:155]       Exam    General- alert and comfortable   Lungs- clear without rales, wheezes   Cor- regular rate and rhythm, no murmur , gallop   Abdomen- soft, non-tender   Extremities - warm, non-tender, minimal edema   Neuro- oriented, appropriate, no focal weakness   Lab Results:  Recent Labs  03/01/16 1441 03/02/16 0437  WBC 8.2 6.0  HGB 9.9* 9.8*  HCT 28.6* 28.4*  PLT 398 389   BMET:  Recent Labs  02/29/16 1903 03/02/16 0437  NA 128* 127*  K 3.5 3.4*  CL 101 99*  CO2 20* 22  GLUCOSE 112* 107*  BUN 6 <5*  CREATININE 0.73 0.48*  CALCIUM 7.5* 7.6*    PT/INR:  Recent Labs  02/29/16 1903  LABPROT 13.1  INR 0.99   ABG    Component Value Date/Time   PHART 7.430 03/02/2016 0438   HCO3 21.5 03/02/2016 0438   TCO2 22 03/02/2016 0438   ACIDBASEDEF 2.0 03/02/2016 0438   O2SAT 95.0 03/02/2016 0438   CBG (last 3)   Recent Labs  03/01/16 2351 03/02/16 0349 03/02/16 0814  GLUCAP 113* 94 124*    Assessment/Plan: S/P Procedure(s) (LRB): VIDEO ASSISTED  THORACOSCOPY (Left) DRAINAGE OF HEMOTHORAX (Left) Mobilize Diuresis cont both chest tubes   LOS: 3 days    Kathlee Nationseter Van Trigt III 03/02/2016

## 2016-03-02 NOTE — Care Management Note (Signed)
Case Management Note  Patient Details  Name: [name] [name] MRN: [id number] Date of Birth: [birth year]  Subjective/Objective:   S/p VATS                 Action/Plan:  PTA from home alone - independent.  CM will continue to follow for discharge needs   Expected Discharge Date:  03/02/16               Expected Discharge Plan:  Home/Self Care  In-House Referral:     Discharge planning Services  CM Consult  Post Acute Care Choice:    Choice offered to:     DME Arranged:    DME Agency:     HH Arranged:    HH Agency:     Status of Service:  In process, will continue to follow  If discussed at Long Length of Stay Meetings, dates discussed:    Additional Comments:  [name], [name] S, RN 03/02/2016, 10:31 AM

## 2016-03-02 NOTE — Significant Event (Addendum)
Hypoglycemic Event  CBG: 50  Treatment: juice given  Symptoms: asymptomatic   Follow-up CBG: Time: 1705 CBG Result: 69  Possible Reasons for Event: Patient skipped lunch today, c/o acid reflux. Patient wanted to save his lunch tray-did not want to eat from it at all for lunch. Was asleep most of afternoon and wanted to be left alone to sleep.  Comments/MD notified: N/A.   RN followed protocol, patient is alert, oriented, eating from lunch tray at this time now-will not given dextrose. Will recheck CBG.     [name] [name]

## 2016-03-02 NOTE — Progress Notes (Signed)
Patient ID: [name] [name], male   DOB: 1960-09-27, 55 y.o.   MRN: [id number] EVENING ROUNDS NOTE :     [address]       [place] [id number]             [phone]                 1 Day Post-Op Procedure(s) (LRB): VIDEO ASSISTED THORACOSCOPY (Left) DRAINAGE OF HEMOTHORAX (Left)  Total Length of Stay:  LOS: 3 days  BP 116/80   Pulse (!) 129   Temp 97.9 F (36.6 C) (Oral)   Resp 20   Ht 5\' 6"  (1.676 m)   Wt 147 lb 7.8 oz (66.9 kg)   SpO2 99%   BMI 23.81 kg/m   .Intake/Output      10/18 0701 - 10/19 0700   P.O. 480   I.V. (mL/kg) 480 (7.2)   Blood    Other    IV Piggyback 400   Total Intake(mL/kg) 1360 (20.3)   Urine (mL/kg/hr) 2165 (2.5)   Blood    Chest Tube 70 (0.1)   Total Output 2235   Net -875         . bupivacaine ON-Q pain pump    . dextrose 5 % and 0.45% NaCl 10 mL/hr at 03/02/16 1100     Lab Results  Component Value Date   WBC 6.0 03/02/2016   HGB 9.8 (L) 03/02/2016   HCT 28.4 (L) 03/02/2016   PLT 389 03/02/2016   GLUCOSE 107 (H) 03/02/2016   CHOL 211 (H) 10/07/2015   TRIG 130 10/07/2015   HDL 99 10/07/2015   LDLCALC 86 10/07/2015   ALT 22 02/29/2016   AST 40 02/29/2016   NA 127 (L) 03/02/2016   K 3.4 (L) 03/02/2016   CL 99 (L) 03/02/2016   CREATININE 0.48 (L) 03/02/2016   BUN <5 (L) 03/02/2016   CO2 22 03/02/2016   INR 0.99 02/29/2016   HGBA1C 5.0 02/28/2016   Stable day, no air leak from tubes   Delight OvensEdward B Josaiah Muhammed MD  Beeper [phone] Office [phone] 03/02/2016 8:02 PM

## 2016-03-03 ENCOUNTER — Inpatient Hospital Stay (HOSPITAL_COMMUNITY): Payer: BLUE CROSS/BLUE SHIELD

## 2016-03-03 LAB — COMPREHENSIVE METABOLIC PANEL
ALBUMIN: 1.6 g/dL — AB (ref 3.5–5.0)
ALT: 17 U/L (ref 17–63)
ANION GAP: 5 (ref 5–15)
AST: 23 U/L (ref 15–41)
Alkaline Phosphatase: 105 U/L (ref 38–126)
CO2: 26 mmol/L (ref 22–32)
Calcium: 7.6 mg/dL — ABNORMAL LOW (ref 8.9–10.3)
Chloride: 98 mmol/L — ABNORMAL LOW (ref 101–111)
Creatinine, Ser: 0.57 mg/dL — ABNORMAL LOW (ref 0.61–1.24)
GFR calc Af Amer: >60 mL/min (ref >60)
GFR calc non Af Amer: >60 mL/min (ref >60)
GLUCOSE: 89 mg/dL (ref 65–99)
POTASSIUM: 3.5 mmol/L (ref 3.5–5.1)
SODIUM: 129 mmol/L — AB (ref 135–145)
Total Bilirubin: 0.3 mg/dL (ref 0.3–1.2)
Total Protein: 4.4 g/dL — ABNORMAL LOW (ref 6.5–8.1)

## 2016-03-03 LAB — CBC
HCT: 25.9 % — ABNORMAL LOW (ref 39.0–52.0)
HEMOGLOBIN: 9 g/dL — AB (ref 13.0–17.0)
MCH: 31.6 pg (ref 26.0–34.0)
MCHC: 34.7 g/dL (ref 30.0–36.0)
MCV: 90.9 fL (ref 78.0–100.0)
Platelets: 368 10*3/uL (ref 150–400)
RBC: 2.85 MIL/uL — ABNORMAL LOW (ref 4.22–5.81)
RDW: 14.8 % (ref 11.5–15.5)
WBC: 5.7 10*3/uL (ref 4.0–10.5)

## 2016-03-03 LAB — GLUCOSE, CAPILLARY
Glucose-Capillary: 114 mg/dL — ABNORMAL HIGH (ref 65–99)
Glucose-Capillary: 89 mg/dL (ref 65–99)

## 2016-03-03 MED ORDER — FUROSEMIDE 10 MG/ML IJ SOLN
20.0000 mg | Freq: Every day | INTRAMUSCULAR | Status: AC
  Administered 2016-03-03: 20 mg via INTRAVENOUS
  Filled 2016-03-03: qty 2

## 2016-03-03 MED ORDER — FENTANYL CITRATE (PF) 100 MCG/2ML IJ SOLN: 25.0000 ug | INTRAMUSCULAR | Status: DC | PRN

## 2016-03-03 MED ORDER — FUROSEMIDE 40 MG PO TABS
40.0000 mg | ORAL_TABLET | Freq: Every day | ORAL | Status: DC
  Administered 2016-03-04 – 2016-03-05 (×2): 40 mg via ORAL
  Filled 2016-03-03 (×2): qty 1

## 2016-03-03 MED ORDER — VANCOMYCIN HCL IN DEXTROSE 1-5 GM/200ML-% IV SOLN
1000.0000 mg | Freq: Two times a day (BID) | INTRAVENOUS | Status: AC
  Administered 2016-03-03 – 2016-03-04 (×4): 1000 mg via INTRAVENOUS
  Filled 2016-03-03 (×5): qty 200

## 2016-03-03 NOTE — Op Note (Signed)
NAMRoderick [name]:  Ligas, Dezi               ACCOUNT NO.:  [id number]  MEDICAL RECORD NO.:  [id number]  LOCATION:  2S13C                        FACILITY:  [facility]  PHYSICIAN:  Kerin PernaPeter Van Trigt, M.D.  DATE OF BIRTH:  11/12/1960  DATE OF PROCEDURE:  03/01/2016 DATE OF DISCHARGE:                              OPERATIVE REPORT   OPERATIONS:  Left video-assisted thoracoscopic surgery, drainage of hemothorax, decortication of left lower lobe.  SURGEON:  Kerin PernaPeter Van Trigt, M.D.  ASSISTANT:  Jari Favreessa Conte, PA-C.  ANESTHESIA:  General.  PREOPERATIVE DIAGNOSIS:  History of left rib fractures, left pneumothorax.  POSTOPERATIVE DIAGNOSIS:  History of left rib fractures, left pneumothorax.  DESCRIPTION OF PROCEDURE:  The patient was brought to the operating room and after being evaluated and prepared, an informed consent obtained. The patient was intubated with a double-lumen tube and rolled left side up.  The left chest was prepped and draped as a sterile field.  A proper time-out was performed.  A small incision was made at the tip of the scapula and distended anteriorly at the fifth interspace.  The pleural space was entered and large amount of non-clotted blood was encountered. This was sent for cultures.  The incision was extended to further visualize the entire hemithorax and to breakup loculations for the loculated hemothorax.  Total of 2 L of blood was removed from the left chest.  All of the loculated pockets were opened and drained.  A fibrous peel over the left lower lobe was carefully dissected and removed.  The left chest was irrigated with warm saline.  There were deformities in the posterior thorax from the rib fracture, but there was no active bleeding.  Anterior and posterior chest tubes were placed and brought out through separate incisions.  The ribs were then reapproximated with intercostal #2 sutures.  Before closing the thorax, the left lung was inflated under direct vision.  The  chest wall was reapproximated with interrupted #1 Vicryl for the muscle layer.  The subcutaneous and skin layers were closed with a running #1 Vicryl.  The patient was then turned supine, anesthesia was reversed, and the patient was extubated.  The patient returned to the recovery room in stable condition after having tolerated the procedure.  He remained hemodynamically stable.  He received 1 unit of packed cells during the procedure for a starting hematocrit of 24%.     Kerin PernaPeter Van Trigt, M.D.     PV/MEDQ  D:  03/02/2016  T:  03/03/2016  Job:  [id number]

## 2016-03-03 NOTE — Significant Event (Addendum)
Wasted approximately 19cc of fentanyl in the sink. Witness with Patrecia Pacerystal Adkins, RN.

## 2016-03-03 NOTE — Progress Notes (Signed)
2 Days Post-Op Procedure(s) (LRB): VIDEO ASSISTED THORACOSCOPY (Left) DRAINAGE OF HEMOTHORAX (Left) Subjective: L hemothorax Min CT output DC posterior tube Transfer to floor  Objective: Vital signs in last 24 hours: Temp:  [97.3 F (36.3 C)-98.7 F (37.1 C)] 98.7 F (37.1 C) (10/19 0400) Pulse Rate:  [90-130] 130 (10/19 0700) Cardiac Rhythm: Sinus tachycardia (10/19 0739) Resp:  [11-25] 16 (10/19 0743) BP: (107-126)/(65-92) 122/92 (10/19 0700) SpO2:  [94 %-100 %] 98 % (10/19 0809) Arterial Line BP: (150-165)/(77-86) 165/86 (10/18 1100) Weight:  [145 lb 8.1 oz (66 kg)] 145 lb 8.1 oz (66 kg) (10/19 0500)  Hemodynamic parameters for last 24 hours:   stable Intake/Output from previous day: 10/18 0701 - 10/19 0700 In: 1730 [P.O.:720; I.V.:610; IV Piggyback:400] Out: 3910 [Urine:3780; Chest Tube:130] Intake/Output this shift: No intake/output data recorded.       Exam    General- alert and comfortable   Lungs- clear without rales, wheezes   Cor- regular rate and rhythm, no murmur , gallop   Abdomen- soft, non-tender   Extremities - warm, non-tender, minimal edema   Neuro- oriented, appropriate, no focal weakness   Lab Results:  Recent Labs  03/02/16 0437 03/03/16 0451  WBC 6.0 5.7  HGB 9.8* 9.0*  HCT 28.4* 25.9*  PLT 389 368   BMET:  Recent Labs  03/02/16 0437 03/03/16 0451  NA 127* 129*  K 3.4* 3.5  CL 99* 98*  CO2 22 26  GLUCOSE 107* 89  BUN <5* <5*  CREATININE 0.48* 0.57*  CALCIUM 7.6* 7.6*    PT/INR:  Recent Labs  02/29/16 1903  LABPROT 13.1  INR 0.99   ABG    Component Value Date/Time   PHART 7.430 03/02/2016 0438   HCO3 21.5 03/02/2016 0438   TCO2 22 03/02/2016 0438   ACIDBASEDEF 2.0 03/02/2016 0438   O2SAT 95.0 03/02/2016 0438   CBG (last 3)   Recent Labs  03/02/16 2337 03/03/16 0012 03/03/16 0432  GLUCAP 62* 114* 89    Assessment/Plan: S/P Procedure(s) (LRB): VIDEO ASSISTED THORACOSCOPY (Left) DRAINAGE OF HEMOTHORAX  (Left) Mobilize Diuresis d/c tubes/lines Plan for transfer to step-down: see transfer orders   LOS: 4 days    Kathlee Nationseter Van Trigt III 03/03/2016

## 2016-03-03 NOTE — Significant Event (Signed)
Patient ambulated to 2W, walked there from 2S. VS stable during ambulation. Patient settled in chair. CT placed back on wall suction at -20cm. All personal belongings taken with patient to new room (clothes, cellphone, shoes). Patient did not want RN to notify family of his transfer. Report given to Rosey Batheresa, Consulting civil engineercharge RN.

## 2016-03-04 ENCOUNTER — Inpatient Hospital Stay (HOSPITAL_COMMUNITY): Payer: BLUE CROSS/BLUE SHIELD

## 2016-03-04 LAB — BASIC METABOLIC PANEL
Anion gap: 7 (ref 5–15)
BUN: <5 mg/dL — ABNORMAL LOW (ref 6–20)
CO2: 26 mmol/L (ref 22–32)
Calcium: 8.2 mg/dL — ABNORMAL LOW (ref 8.9–10.3)
Chloride: 96 mmol/L — ABNORMAL LOW (ref 101–111)
Creatinine, Ser: 0.64 mg/dL (ref 0.61–1.24)
GFR calc Af Amer: >60 mL/min (ref >60)
GFR calc non Af Amer: >60 mL/min (ref >60)
Glucose, Bld: 91 mg/dL (ref 65–99)
Potassium: 4.2 mmol/L (ref 3.5–5.1)
Sodium: 129 mmol/L — ABNORMAL LOW (ref 135–145)

## 2016-03-04 LAB — CBC
HCT: 29.2 % — ABNORMAL LOW (ref 39.0–52.0)
Hemoglobin: 9.9 g/dL — ABNORMAL LOW (ref 13.0–17.0)
MCH: 31.4 pg (ref 26.0–34.0)
MCHC: 33.9 g/dL (ref 30.0–36.0)
MCV: 92.7 fL (ref 78.0–100.0)
Platelets: 426 10*3/uL — ABNORMAL HIGH (ref 150–400)
RBC: 3.15 MIL/uL — ABNORMAL LOW (ref 4.22–5.81)
RDW: 14.5 % (ref 11.5–15.5)
WBC: 5.2 10*3/uL (ref 4.0–10.5)

## 2016-03-04 LAB — CULTURE, BLOOD (SINGLE): Culture: NO GROWTH

## 2016-03-04 MED ORDER — SODIUM CHLORIDE 0.9% FLUSH: 10.0000 mL | INTRAVENOUS | Status: DC | PRN

## 2016-03-04 NOTE — Progress Notes (Signed)
Pt left chest tube and on Q pump D/C per orders. Covered with dressings.  Pt tolerated well.

## 2016-03-04 NOTE — Progress Notes (Addendum)
      [address]       [place] [id number]             [phone]      3 Days Post-Op Procedure(s) (LRB): VIDEO ASSISTED THORACOSCOPY (Left) DRAINAGE OF HEMOTHORAX (Left)   Subjective:  Mr. [name] [name] has no complaints this morning.  He has no yet moved his bowels  Objective: Vital signs in last 24 hours: Temp:  [97.4 F (36.3 C)-98.6 F (37 C)] 97.4 F (36.3 C) (10/20 0500) Pulse Rate:  [88-119] 88 (10/20 0500) Cardiac Rhythm: Sinus tachycardia (10/19 2000) Resp:  [15-20] 18 (10/20 0500) BP: (113-145)/(71-106) 145/98 (10/20 0500) SpO2:  [92 %-99 %] 95 % (10/20 0500)  Intake/Output from previous day: 10/19 0701 - 10/20 0700 In: 370 [P.O.:120; I.V.:50; IV Piggyback:200] Out: 1040 [Urine:1010; Chest Tube:30]  General appearance: alert, cooperative and no distress Heart: regular rate and rhythm Lungs: diminished breath sounds left base Abdomen: soft, non-tender; bowel sounds normal; no masses,  no organomegaly Wound: clean and dry  Lab Results:  Recent Labs  03/02/16 0437 03/03/16 0451  WBC 6.0 5.7  HGB 9.8* 9.0*  HCT 28.4* 25.9*  PLT 389 368   BMET:  Recent Labs  03/02/16 0437 03/03/16 0451  NA 127* 129*  K 3.4* 3.5  CL 99* 98*  CO2 22 26  GLUCOSE 107* 89  BUN <5* <5*  CREATININE 0.48* 0.57*  CALCIUM 7.6* 7.6*    PT/INR: No results for input(s): LABPROT, INR in the last 72 hours. ABG    Component Value Date/Time   PHART 7.430 03/02/2016 0438   HCO3 21.5 03/02/2016 0438   TCO2 22 03/02/2016 0438   ACIDBASEDEF 2.0 03/02/2016 0438   O2SAT 95.0 03/02/2016 0438   CBG (last 3)   Recent Labs  03/02/16 2337 03/03/16 0012 03/03/16 0432  GLUCAP 62* 114* 89    Assessment/Plan: S/P Procedure(s) (LRB): VIDEO ASSISTED THORACOSCOPY (Left) DRAINAGE OF HEMOTHORAX (Left)  1. Chest tube- no air leak, 70 cc output, CXR shows post surgical changes in LLL--- change chest tube to water seal 2. CV- NSR, + htn at times, will monitor if  doesn't improve will start antihypertensive 3. Dispo- patient stable, chest tube to water seal, repeat CXr in AM   LOS: 5 days    [name], [name] 03/04/2016 Patient examined and chest x-ray personally reviewed, good resolution of left hemothorax Removed final chest tube today Patient home in 1-2 days Hemoglobin stable

## 2016-03-04 NOTE — Discharge Summary (Signed)
Physician Discharge Summary  Patient ID: [name] [name] MRN: [id number] DOB/AGE: 55-Mar-1962 55 y.o.  Admit date: 02/27/2016 Discharge date: 03/05/2016  Admission Diagnoses:  Patient Active Problem List   Diagnosis Date Noted  . S/P thoracotomy 03/01/2016  . Hemothorax on left 02/28/2016  . Smoker 12/16/2014  . Encounter for long-term (current) use of other high-risk medications 12/16/2014  . Gastroesophageal reflux disease without esophagitis 12/16/2014  . Ulnar neuropathy at elbow of left upper extremity 10/01/2014  . ADHD (attention deficit hyperactivity disorder), predominantly hyperactive impulsive type 07/31/2013  . S/P ACL repair 12/07/2010  . HIV positive (HCC) 11/18/2010  . Anxiety and depression 11/18/2010   Discharge Diagnoses:   Patient Active Problem List   Diagnosis Date Noted  . S/P thoracotomy 03/01/2016  . Hemothorax on left 02/28/2016  . Smoker 12/16/2014  . Encounter for long-term (current) use of other high-risk medications 12/16/2014  . Gastroesophageal reflux disease without esophagitis 12/16/2014  . Ulnar neuropathy at elbow of left upper extremity 10/01/2014  . ADHD (attention deficit hyperactivity disorder), predominantly hyperactive impulsive type 07/31/2013  . S/P ACL repair 12/07/2010  . HIV positive (HCC) 11/18/2010  . Anxiety and depression 11/18/2010       Expected blood loss anemia from hemothorax  Discharged Condition: good  History of Present Illness:  [name] [name] is a 55 yo white male with known history of tobacco abuse, GERD, ADHD, Anxiety/Depression, and HIV.  He presented to the ED with complaints of shortness of breath.  He admitted that he had gotten up to use the bathroom at home, at which point he fell into a sharp edge about 3 weeks earlier.  He states that he knew he broke some ribs after evaluation at an urgent care center. His primary physician recommended he report to the ED.  CT of the chest was obtained and showed several rib  fractures and a moderate left hemothorax.  It was felt a VATS would be indicated and TCTS consult was placed.  Hospital Course:   Dr. Donata Clay reviewed the CT scan and accepted the patient in transfer from [facility].  Upon arrival the patient was stable.  The risks and benefits of VATS procedure were explained to the patient and he was agreeable to proceed.  He was taken to the operating room on 03/01/2016 and underwent Left Video Assisted Thoracoscopy with drainage of hemothorax and decortication of the LLL.  He tolerated the procedure without difficulty, was extubated and taken to the SICU in stable condition.  The patient progressed without much difficulty.  His chest tubes did not exhibit evidence of an air leak.  His posterior chest tube was removed on POD #2.  He was felt medically stable for transfer to the telemetry unit at that time.  Follow up CXR was stable with post surgical changes present and continued small right pleural effusion.  His chest tube remained free from air leak and was placed on water seal.  His final chest tube was removed on POD #4.  Follow up CXR remained stable.  He had some issues with hypoglycemia post operatively.  However, the patient admitted to skipping meals due to acid reflux.  He will resume his previous diabetic treatment at discharge.  He is ambulating independently.  His pain is well controlled.  He is felt medically stable for discharge home today.      Significant Diagnostic Studies: radiology:   1. Moderate left-sided pleural fluid collection has varying areas of attenuation, consistent with hemothorax. 2.  Scattered areas of ground-glass attenuation in the lungs bilaterally may reflect small airways disease or areas of lung contusion. 3. 17 mm mixed ground-glass attenuation/cavitary nodule in the left upper lobe. While this may be posttraumatic, neoplasm could also have this appearance. Follow-up CT chest without contrast in 3 months is  recommended to reassess. 4. Acute fractures of the posterior left sixth through tenth ribs.  Treatments: surgery:   Left video-assisted thoracoscopic surgery, drainage of hemothorax, decortication of left lower lobe  Disposition: 01-Home or Self Care   Discharge medications:     Medication List    TAKE these medications   acetaminophen 500 MG tablet Commonly known as:  TYLENOL Take 2 tablets (1,000 mg total) by mouth every 6 (six) hours as needed.   ALEVE 220 MG tablet Generic drug:  naproxen sodium Take 220 mg by mouth daily as needed (for pain).   efavirenz-emtricitabine-tenofovir 600-200-300 MG tablet Commonly known as:  ATRIPLA Take 1 tablet by mouth at bedtime.   Melatonin 10 MG Tabs Take 1 tablet by mouth at bedtime.   methylphenidate 10 MG tablet Commonly known as:  RITALIN Take 1 tablet (10 mg total) by mouth 2 (two) times daily.   multivitamin with minerals Tabs tablet Take 1 tablet by mouth daily.   omeprazole 20 MG capsule Commonly known as:  PRILOSEC Take 1 capsule (20 mg total) by mouth daily.   oxyCODONE 5 MG immediate release tablet Commonly known as:  Oxy IR/ROXICODONE Take 1-2 tablets (5-10 mg total) by mouth every 4 (four) hours as needed for severe pain.   sulfamethoxazole-trimethoprim 800-160 MG tablet Commonly known as:  BACTRIM DS,SEPTRA DS Take 1 tablet by mouth daily.   venlafaxine 100 MG tablet Commonly known as:  EFFEXOR TAKE 1 TABLET (100 MG TOTAL) BY MOUTH 2 (TWO) TIMES DAILY. What changed:  how much to take  how to take this  when to take this  additional instructions      Follow-up Information    Kathlee NationsPeter Van Trigt III, MD Follow up on 03/23/2016.   Specialty:  Cardiothoracic Surgery Why:  Appointment is at 2:30, please get CXR at 2:00 at [facility] located on first floor of our office building Contact information: [address] [place] [address] [phone]            Signed: Lowella DandyBARRETT, ERIN 03/05/2016, 11:12 AM

## 2016-03-04 NOTE — Discharge Instructions (Signed)
Thoracotomy, Care After °Refer to this sheet in the next few weeks. These instructions provide you with information on caring for yourself after your procedure. Your health care provider may also give you more specific instructions. Your treatment has been planned according to current medical practices, but problems sometimes occur. Call your health care provider if you have any problems or questions after your procedure. °WHAT TO EXPECT AFTER YOUR PROCEDURE °After your procedure, it is typical to have the following sensations: °· You may feel pain at the incision site. °· You may be constipated from the pain medicine given and the change in your level of activity. °· You may feel extremely tired. °HOME CARE INSTRUCTIONS °· Take over-the-counter or prescription medicines for pain, discomfort, or fever only as directed by your health care provider. It is very important to take pain relieving medicine before your pain becomes severe. You will be able to breathe and cough more comfortably if your pain is well controlled. °· Take deep breaths. Deep breathing helps to keep your lungs inflated and protects against a lung infection (pneumonia). °· Cough frequently. Even though coughing may cause discomfort, coughing is important to clear mucus (phlegm) and expand your lungs. Coughing helps prevent pneumonia. If it hurts to cough, hold a pillow against your chest when you cough. This may help with the discomfort. °· Continue to use an incentive spirometer as directed. The use of an incentive spirometer helps to keep your lungs inflated and protects against pneumonia. °· Change the bandages over your incision as needed or as directed by your health care provider. °· Remove the bandages over your chest tube site as directed by your health care provider. °· Resume your normal diet as directed. It is important to have adequate protein, calories, vitamins, and minerals to promote healing. °· Prevent constipation. °¨ Eat  high-fiber foods such as whole grain cereals and breads, brown rice, beans, and fresh fruits and vegetables. °¨ Drink enough water and fluids to keep your urine clear or pale yellow. Avoid drinking beverages containing caffeine. Beverages containing caffeine can cause dehydration and harden your stool. °¨ Talk to your health care provider about taking a stool softener or laxative. °· Avoid lifting until you are instructed otherwise. °· Do not drive until directed by your health care provider.  Do not drive while taking pain medicines (narcotics). °· Do not bathe, swim, or use a hot tub until directed by your health care provider. You may shower instead. Gently wash the area of your incision with water and soap as directed. Do not use anything else to clean your incision except as directed by your health care provider. °· Do not use any tobacco products including cigarettes, chewing tobacco, or electronic cigarettes. °· Avoid secondhand smoke. °· Schedule an appointment for stitch (suture) or staple removal as directed. °· Schedule and attend all follow-up visits as directed by your health care provider. It is important to keep all your appointments. °· Participate in pulmonary rehabilitation as directed by your health care provider. °· Do not travel by airplane for 2 weeks after your chest tube is removed. °SEEK MEDICAL CARE IF: °· You are bleeding from your wounds. °· Your heartbeat seems irregular. °· You have redness, swelling, or increasing pain in the wounds. °· There is pus coming from your wounds. °· There is a bad smell coming from the wound or dressing. °· You have a fever or chills. °· You have nausea or are vomiting. °· You have muscle aches. °SEEK   IMMEDIATE MEDICAL CARE IF: °· You have a rash. °· You have difficulty breathing. °· You have a reaction or side effect to medicines given. °· You have persistent nausea. °· You have lightheadedness or feel faint. °· You have shortness of breath or chest  pain. °· You have persistent pain. °  °This information is not intended to replace advice given to you by your health care provider. Make sure you discuss any questions you have with your health care provider. °  °Document Released: 10/15/2010 Document Revised: 09/16/2014 Document Reviewed: 12/19/2012 °Elsevier Interactive Patient Education ©2016 [employer] ° °

## 2016-03-05 ENCOUNTER — Inpatient Hospital Stay (HOSPITAL_COMMUNITY): Payer: BLUE CROSS/BLUE SHIELD

## 2016-03-05 LAB — BODY FLUID CULTURE: Culture: NO GROWTH

## 2016-03-05 MED ORDER — OXYCODONE HCL 5 MG PO TABS: 5.0000 mg | ORAL_TABLET | ORAL | 0 refills | Status: DC | PRN

## 2016-03-05 MED ORDER — ACETAMINOPHEN 500 MG PO TABS: 1000.0000 mg | ORAL_TABLET | Freq: Four times a day (QID) | ORAL | 0 refills | Status: AC | PRN

## 2016-03-05 NOTE — Progress Notes (Signed)
      [address]       [place] [id number]             [phone]      4 Days Post-Op Procedure(s) (LRB): VIDEO ASSISTED THORACOSCOPY (Left) DRAINAGE OF HEMOTHORAX (Left)   Subjective:  Patient has no new complaints.  Ready to go home.  Objective: Vital signs in last 24 hours: Temp:  [97.5 F (36.4 C)] 97.5 F (36.4 C) (10/21 0500) Pulse Rate:  [84-86] 84 (10/21 0500) Cardiac Rhythm: Normal sinus rhythm (10/21 0700) Resp:  [18] 18 (10/21 0500) BP: (131-136)/(87-91) 131/87 (10/21 0500) SpO2:  [96 %-99 %] 99 % (10/21 0500)  Intake/Output from previous day: 10/20 0701 - 10/21 0700 In: 720 [P.O.:720] Out: -   General appearance: alert, cooperative and no distress Heart: regular rate and rhythm Lungs: diminished breath sounds bibasilar Abdomen: soft, non-tender; bowel sounds normal; no masses,  no organomegaly Wound: clean and dry  Lab Results:  Recent Labs  03/03/16 0451 03/04/16 0825  WBC 5.7 5.2  HGB 9.0* 9.9*  HCT 25.9* 29.2*  PLT 368 426*   BMET:  Recent Labs  03/03/16 0451 03/04/16 0825  NA 129* 129*  K 3.5 4.2  CL 98* 96*  CO2 26 26  GLUCOSE 89 91  BUN <5* <5*  CREATININE 0.57* 0.64  CALCIUM 7.6* 8.2*    PT/INR: No results for input(s): LABPROT, INR in the last 72 hours. ABG    Component Value Date/Time   PHART 7.430 03/02/2016 0438   HCO3 21.5 03/02/2016 0438   TCO2 22 03/02/2016 0438   ACIDBASEDEF 2.0 03/02/2016 0438   O2SAT 95.0 03/02/2016 0438   CBG (last 3)   Recent Labs  03/02/16 2337 03/03/16 0012 03/03/16 0432  GLUCAP 62* 114* 89    Assessment/Plan: S/P Procedure(s) (LRB): VIDEO ASSISTED THORACOSCOPY (Left) DRAINAGE OF HEMOTHORAX (Left)  1. Pulm- chest tube removed yesterday- CXR with stable appearance of hydropneumothorax 2. CV- NSR, BP improved, can f/u with medical doctor 3. Dispo- patient stable will d/c home today   LOS: 6 days    Maanasa Aderhold 03/05/2016

## 2016-03-05 NOTE — Progress Notes (Signed)
IJ was removed and was intact. Pressure was held and patient has been instructed to remain in bed for 30 minutes. Will continue to monitor.

## 2016-03-06 LAB — ANAEROBIC CULTURE

## 2016-03-06 NOTE — Progress Notes (Signed)
Patient was discharged home with his friend. Education was done and he stated that he understood.

## 2016-03-07 ENCOUNTER — Telehealth: Payer: Self-pay | Admitting: Family Medicine

## 2016-03-07 NOTE — Telephone Encounter (Signed)
Left message for pt to call. Needs hospital follow up per JCL.

## 2016-03-07 NOTE — Telephone Encounter (Signed)
Pt called back and he states he is feeling about as he expected. Pt states he has all medications at home and filled. Pt made a hospital follow up appt. Pt was informed that if he needed anything to call the office. Pt verified understanding.

## 2016-03-10 ENCOUNTER — Other Ambulatory Visit: Payer: Self-pay | Admitting: *Deleted

## 2016-03-10 DIAGNOSIS — G8918 Other acute postprocedural pain: Secondary | ICD-10-CM

## 2016-03-10 MED ORDER — OXYCODONE HCL 5 MG PO TABS: 5.0000 mg | ORAL_TABLET | ORAL | 0 refills | Status: AC | PRN

## 2016-03-10 MED ORDER — OXYCODONE HCL 5 MG PO TABS: 5.0000 mg | ORAL_TABLET | ORAL | 0 refills | Status: DC | PRN

## 2016-03-10 NOTE — Telephone Encounter (Signed)
[name] [name] has called and requested a refill for Oxycodone s/p L VATS, Drainage of hemothorax on 03/01/16 by Dr. Donata ClayVan Trigt. I informed him that a new script would be available at our office today and he agreed.

## 2016-03-14 ENCOUNTER — Encounter: Payer: Self-pay | Admitting: Family Medicine

## 2016-03-14 ENCOUNTER — Ambulatory Visit (INDEPENDENT_AMBULATORY_CARE_PROVIDER_SITE_OTHER): Payer: BLUE CROSS/BLUE SHIELD | Admitting: Family Medicine

## 2016-03-14 VITALS — BP 120/80 | HR 90 | Wt 133.0 lb

## 2016-03-14 DIAGNOSIS — Z21 Asymptomatic human immunodeficiency virus [HIV] infection status: Secondary | ICD-10-CM | POA: Diagnosis not present

## 2016-03-14 DIAGNOSIS — K219 Gastro-esophageal reflux disease without esophagitis: Secondary | ICD-10-CM

## 2016-03-14 DIAGNOSIS — J942 Hemothorax: Secondary | ICD-10-CM

## 2016-03-14 DIAGNOSIS — F17200 Nicotine dependence, unspecified, uncomplicated: Secondary | ICD-10-CM

## 2016-03-14 DIAGNOSIS — Z23 Encounter for immunization: Secondary | ICD-10-CM | POA: Diagnosis not present

## 2016-03-14 DIAGNOSIS — E871 Hypo-osmolality and hyponatremia: Secondary | ICD-10-CM

## 2016-03-14 LAB — CBC WITH DIFFERENTIAL/PLATELET
BASOS ABS: 0 {cells}/uL (ref 0–200)
BASOS PCT: 0 %
EOS PCT: 1 %
Eosinophils Absolute: 59 cells/uL (ref 15–500)
HCT: 36.5 % — ABNORMAL LOW (ref 38.5–50.0)
HEMOGLOBIN: 12 g/dL — AB (ref 13.2–17.1)
LYMPHS ABS: 1357 {cells}/uL (ref 850–3900)
Lymphocytes Relative: 23 %
MCH: 31.7 pg (ref 27.0–33.0)
MCHC: 32.9 g/dL (ref 32.0–36.0)
MCV: 96.6 fL (ref 80.0–100.0)
MONOS PCT: 17 %
MPV: 8.2 fL (ref 7.5–12.5)
Monocytes Absolute: 1003 cells/uL — ABNORMAL HIGH (ref 200–950)
NEUTROS ABS: 3481 {cells}/uL (ref 1500–7800)
Neutrophils Relative %: 59 %
PLATELETS: 617 10*3/uL — AB (ref 140–400)
RBC: 3.78 MIL/uL — ABNORMAL LOW (ref 4.20–5.80)
RDW: 15 % (ref 11.0–15.0)
WBC: 5.9 10*3/uL (ref 4.0–10.5)

## 2016-03-14 LAB — COMPREHENSIVE METABOLIC PANEL
ALBUMIN: 3.2 g/dL — AB (ref 3.6–5.1)
ALK PHOS: 167 U/L — AB (ref 40–115)
ALT: 11 U/L (ref 9–46)
AST: 23 U/L (ref 10–35)
BILIRUBIN TOTAL: 0.3 mg/dL (ref 0.2–1.2)
BUN: 5 mg/dL — AB (ref 7–25)
CO2: 20 mmol/L (ref 20–31)
CREATININE: 0.64 mg/dL — AB (ref 0.70–1.33)
Calcium: 9.2 mg/dL (ref 8.6–10.3)
Chloride: 100 mmol/L (ref 98–110)
Glucose, Bld: 84 mg/dL (ref 65–99)
Potassium: 4.5 mmol/L (ref 3.5–5.3)
SODIUM: 130 mmol/L — AB (ref 135–146)
TOTAL PROTEIN: 6.3 g/dL (ref 6.1–8.1)

## 2016-03-14 NOTE — Progress Notes (Signed)
   Subjective:    Patient ID: [name] [name], male    DOB: 24-Feb-1961, 55 y.o.   MRN: [id number]  HPI He is here for recheck after recent hospitalization and treatment for fractured ribs and hemothorax. The hospital record was reviewed. He was cared for by thoracic surgery. He at this time is still feeling weak but otherwise seems be doing well. The record also indicated hyponatremia. He continued on his other medications including his HIV meds. He also initially said he wasn't smoking with family admitted to smoking a pack per day. He continues on Prilosec for treatment his reflux symptoms which she has pretty much every day.   Review of Systems     Objective:   Physical Exam Alert and weak appearing as well as pale. Left chest was evaluated and did show surgical scars. Lungs are clear to auscultation.       Assessment & Plan:  Need for prophylactic vaccination and inoculation against influenza - Plan: Flu Vaccine QUAD 36+ mos IM  Gastroesophageal reflux disease without esophagitis  HIV positive (HCC)  Hyponatremia - Plan: Comprehensive metabolic panel  Hemothorax on left - Plan: CBC with Differential/Platelet, Comprehensive metabolic panel Will continue on his present medications. I will check his blood count and sodium status. Will not check his HIV for another month to get everything time to settle down. Again strongly encouraged him to quit smoking. Over 25 minutes, majority of the time spent in counseling and coordination of care.

## 2016-03-22 ENCOUNTER — Other Ambulatory Visit: Payer: Self-pay | Admitting: Cardiothoracic Surgery

## 2016-03-22 DIAGNOSIS — J942 Hemothorax: Secondary | ICD-10-CM

## 2016-03-23 ENCOUNTER — Ambulatory Visit (INDEPENDENT_AMBULATORY_CARE_PROVIDER_SITE_OTHER): Payer: Self-pay | Admitting: Cardiothoracic Surgery

## 2016-03-23 ENCOUNTER — Encounter: Payer: Self-pay | Admitting: Cardiothoracic Surgery

## 2016-03-23 ENCOUNTER — Ambulatory Visit
Admission: RE | Admit: 2016-03-23 | Discharge: 2016-03-23 | Disposition: A | Discharge status: Home / Self Care | Payer: BLUE CROSS/BLUE SHIELD | Source: Ambulatory Visit | Attending: Cardiothoracic Surgery | Admitting: Cardiothoracic Surgery

## 2016-03-23 VITALS — BP 99/75 | HR 110 | Resp 20 | Ht 66.0 in | Wt 135.0 lb

## 2016-03-23 DIAGNOSIS — J942 Hemothorax: Secondary | ICD-10-CM

## 2016-03-23 DIAGNOSIS — Z8781 Personal history of (healed) traumatic fracture: Secondary | ICD-10-CM

## 2016-03-23 DIAGNOSIS — J939 Pneumothorax, unspecified: Secondary | ICD-10-CM

## 2016-03-23 NOTE — Progress Notes (Signed)
PCP is Carollee HerterLALONDE,JOHN CHARLES, MD Referring Provider is Lorre NickAllen, Anthony, MD  Chief Complaint  Patient presents with  . Routine Post Op    f/u from surgery with CXR s/p LT VATS, drainage of hemothorax, decortication of left lower lobe    HPI: The patient returns for routine follow-up 1 month after left VATS for drainage of hemothorax and decortication left lower lobe. The patient had fallen 3 weeks prior to surgery and broken several ribs on the left. He did well after surgery. The patient's medical history is significant for treated HIV infection.  Since discharge the patient has been progressively improved. Rib fracture pain is improving. Breathing is better. Surgical incisions are healing well. Today a chest x-ray is taken showing clear lung fields with left rib fractures starting to heal Left chest tube sutures are removed. The patient still requires occasional oxycodone for pain. He has expected postthoracotomy pain.  Past Medical History:  Diagnosis Date  . GERD (gastroesophageal reflux disease)   . Hemorrhoids   . HIV infection (HCC)   . Smoker   . Ulnar neuropathy at elbow of left upper extremity 10/01/2014    Past Surgical History:  Procedure Laterality Date  . COLONOSCOPY  2008  . KNEE ARTHROSCOPY WITH ANTERIOR CRUCIATE LIGAMENT (ACL) REPAIR Right 09/2011  . PLEURAL EFFUSION DRAINAGE Left 03/01/2016   Procedure: DRAINAGE OF HEMOTHORAX;  Surgeon: Kerin PernaPeter Van Trigt, MD;  [facility];  Service: Thoracic;  Laterality: Left;  Marland Kitchen. VIDEO ASSISTED THORACOSCOPY Left 03/01/2016   Procedure: VIDEO ASSISTED THORACOSCOPY;  Surgeon: Kerin PernaPeter Van Trigt, MD;  [facility];  Service: Thoracic;  Laterality: Left;    Family History  Problem Relation Age of Onset  . Cancer Mother   . Cancer Father   . Cancer Paternal Uncle   . Cancer Maternal Grandmother     Social History Social History  Substance Use Topics  . Smoking status: Current Every Day Smoker    Packs/day: 0.50    Types:  Cigarettes  . Smokeless tobacco: Never Used  . Alcohol use 0.0 oz/week     Comment: occ    Current Outpatient Prescriptions  Medication Sig Dispense Refill  . acetaminophen (TYLENOL) 500 MG tablet Take 2 tablets (1,000 mg total) by mouth every 6 (six) hours as needed. 30 tablet 0  . efavirenz-emtricitabine-tenofovir (ATRIPLA) 600-200-300 MG tablet Take 1 tablet by mouth at bedtime. 30 tablet 11  . Melatonin 10 MG TABS Take 1 tablet by mouth at bedtime.     . methylphenidate (RITALIN) 10 MG tablet Take 1 tablet (10 mg total) by mouth 2 (two) times daily. 60 tablet 0  . Multiple Vitamin (MULTIVITAMIN WITH MINERALS) TABS tablet Take 1 tablet by mouth daily.    . naproxen sodium (ALEVE) 220 MG tablet Take 220 mg by mouth daily as needed (for pain).     Marland Kitchen. omeprazole (PRILOSEC) 20 MG capsule Take 1 capsule (20 mg total) by mouth daily. 90 capsule 3  . sulfamethoxazole-trimethoprim (BACTRIM DS,SEPTRA DS) 800-160 MG tablet Take 1 tablet by mouth daily. 90 tablet 3  . venlafaxine (EFFEXOR) 100 MG tablet TAKE 1 TABLET (100 MG TOTAL) BY MOUTH 2 (TWO) TIMES DAILY. (Patient taking differently: Take 50 mg by mouth daily. ) 180 tablet 1  . oxyCODONE (OXY IR/ROXICODONE) 5 MG immediate release tablet Take 1-2 tablets (5-10 mg total) by mouth every 4 (four) hours as needed for severe pain. (Patient not taking: Reported on 03/23/2016) 30 tablet 0   No current facility-administered medications for  this visit.     Allergies  Allergen Reactions  . Penicillins Nausea And Vomiting    Has patient had a PCN reaction causing immediate rash, facial/tongue/throat swelling, SOB or lightheadedness with hypotension: No Has patient had a PCN reaction causing severe rash involving mucus membranes or skin necrosis: No Has patient had a PCN reaction that required hospitalization No Has patient had a PCN reaction occurring within the last 10 years: No If all of the above answers are "NO", then may proceed with Cephalosporin  use.     Review of Systems  Appetite improving No fever No cough No ankle edema  BP 99/75 (BP Location: Left Arm, Patient Position: Sitting, Cuff Size: Small)   Pulse (!) 110   Resp 20   Ht 5\' 6"  (1.676 m)   Wt 135 lb (61.2 kg)   SpO2 98%   BMI 21.79 kg/m  Physical Exam Alert and comfortable Neck without JVD Lungs clear bilaterally Left chest incision healed Left chest tube sutures removed Heart rate regular without gallop Neuro intact  Diagnostic Tests: Chest x-ray clear Impression: Resolved left pneumothorax and atelectasis of left lower lobe  Plan: Patient may participate in normal activities-driving, household activities, showers, and withhold from heavy lifting or heavy exertional activities until December 1. He will return as necessary  The patient was given 1 more prescription for oxycodone but he understands this will be the last received from this office. Mikey BussingPeter Van Trigt III, MD [facility] [phone]

## 2016-03-24 ENCOUNTER — Ambulatory Visit: Payer: BLUE CROSS/BLUE SHIELD | Admitting: Family Medicine

## 2016-04-08 ENCOUNTER — Other Ambulatory Visit: Payer: Self-pay | Admitting: Family Medicine

## 2016-04-08 DIAGNOSIS — F419 Anxiety disorder, unspecified: Principal | ICD-10-CM

## 2016-04-08 DIAGNOSIS — F329 Major depressive disorder, single episode, unspecified: Secondary | ICD-10-CM

## 2016-04-11 NOTE — Telephone Encounter (Signed)
Is this okay to refill? 

## 2016-04-19 ENCOUNTER — Encounter: Payer: Self-pay | Admitting: Family Medicine

## 2016-04-19 ENCOUNTER — Ambulatory Visit (INDEPENDENT_AMBULATORY_CARE_PROVIDER_SITE_OTHER): Payer: BLUE CROSS/BLUE SHIELD | Admitting: Family Medicine

## 2016-04-19 VITALS — BP 110/78 | HR 100 | Wt 135.0 lb

## 2016-04-19 DIAGNOSIS — F329 Major depressive disorder, single episode, unspecified: Secondary | ICD-10-CM

## 2016-04-19 DIAGNOSIS — Z21 Asymptomatic human immunodeficiency virus [HIV] infection status: Secondary | ICD-10-CM | POA: Diagnosis not present

## 2016-04-19 DIAGNOSIS — Z79899 Other long term (current) drug therapy: Secondary | ICD-10-CM

## 2016-04-19 DIAGNOSIS — F32A Depression, unspecified: Secondary | ICD-10-CM

## 2016-04-19 DIAGNOSIS — F418 Other specified anxiety disorders: Secondary | ICD-10-CM

## 2016-04-19 DIAGNOSIS — F901 Attention-deficit hyperactivity disorder, predominantly hyperactive type: Secondary | ICD-10-CM

## 2016-04-19 DIAGNOSIS — F17200 Nicotine dependence, unspecified, uncomplicated: Secondary | ICD-10-CM

## 2016-04-19 DIAGNOSIS — F419 Anxiety disorder, unspecified: Secondary | ICD-10-CM

## 2016-04-19 LAB — COMPREHENSIVE METABOLIC PANEL
ALBUMIN: 2.8 g/dL — AB (ref 3.6–5.1)
ALT: 49 U/L — ABNORMAL HIGH (ref 9–46)
AST: 86 U/L — ABNORMAL HIGH (ref 10–35)
Alkaline Phosphatase: 179 U/L — ABNORMAL HIGH (ref 40–115)
BUN: 5 mg/dL — ABNORMAL LOW (ref 7–25)
CALCIUM: 7.9 mg/dL — AB (ref 8.6–10.3)
CHLORIDE: 102 mmol/L (ref 98–110)
CO2: 18 mmol/L — AB (ref 20–31)
Creat: 0.68 mg/dL — ABNORMAL LOW (ref 0.70–1.33)
Glucose, Bld: 85 mg/dL (ref 65–99)
Potassium: 4 mmol/L (ref 3.5–5.3)
SODIUM: 131 mmol/L — AB (ref 135–146)
Total Bilirubin: 0.4 mg/dL (ref 0.2–1.2)
Total Protein: 5.7 g/dL — ABNORMAL LOW (ref 6.1–8.1)

## 2016-04-19 LAB — CBC WITH DIFFERENTIAL/PLATELET
BASOS ABS: 0 {cells}/uL (ref 0–200)
Basophils Relative: 0 %
EOS PCT: 2 %
Eosinophils Absolute: 98 cells/uL (ref 15–500)
HEMATOCRIT: 43.6 % (ref 38.5–50.0)
HEMOGLOBIN: 14.5 g/dL (ref 13.2–17.1)
LYMPHS ABS: 1715 {cells}/uL (ref 850–3900)
Lymphocytes Relative: 35 %
MCH: 33.1 pg — AB (ref 27.0–33.0)
MCHC: 33.3 g/dL (ref 32.0–36.0)
MCV: 99.5 fL (ref 80.0–100.0)
MPV: 8.7 fL (ref 7.5–12.5)
Monocytes Absolute: 1078 cells/uL — ABNORMAL HIGH (ref 200–950)
Monocytes Relative: 22 %
NEUTROS PCT: 41 %
Neutro Abs: 2009 cells/uL (ref 1500–7800)
Platelets: 176 10*3/uL (ref 140–400)
RBC: 4.38 MIL/uL (ref 4.20–5.80)
RDW: 16.2 % — ABNORMAL HIGH (ref 11.0–15.0)
WBC: 4.9 10*3/uL (ref 4.0–10.5)

## 2016-04-19 MED ORDER — BUPROPION HCL ER (SR) 150 MG PO TB12: 150.0000 mg | ORAL_TABLET | Freq: Two times a day (BID) | ORAL | 1 refills | Status: AC

## 2016-04-19 NOTE — Patient Instructions (Signed)
Call [facility] and see if they can help you get your meds after the insurance runs out  [phone]

## 2016-04-19 NOTE — Progress Notes (Signed)
   Subjective:    Patient ID: [name] [name], male    DOB: [birth year], 55 y.o.   MRN: [id number]  HPI He is here for a follow-up. He was recently hospitalized and treated for hemothorax as well as multiple broken ribs. He was followed by [facility]. At this time he seems to be doing much better and only using Aleve for pain relief. He did stop taking his Effexor Axel FillerCausey thought it was causing difficulty with headache. Headaches did go away and psychologically he does seem to be doing okay. He does smoke but would like to be placed on medication. I discussed Chantix but do not feel it is appropriate for him. He has been on Wellbutrin in the past and would like to try that. He continues on his HIV medications and is having no difficulty. He does have an underlying diagnosis of ADHD but at this point is not having any difficulties dealing with that. He also states that he potentially is going to run out of insurance at the end of the year.  Review of Systems     Objective:   Physical Exam Alert and in no distress. Exam of his chest does show healing scars from the surgery and chest tubes.       Assessment & Plan:  HIV positive (HCC) - Plan: CBC with Differential/Platelet, Comprehensive metabolic panel, HIV 1 RNA quant-no reflex-bld, T-helper cells (CD4) count ([facility] at [facility])  ADHD (attention deficit hyperactivity disorder), predominantly hyperactive impulsive type - Plan: buPROPion (WELLBUTRIN SR) 150 MG 12 hr tablet  Anxiety and depression - Plan: buPROPion (WELLBUTRIN SR) 150 MG 12 hr tablet  Encounter for long-term (current) use of high-risk medication - Plan: CBC with Differential/Platelet, Comprehensive metabolic panel, HIV 1 RNA quant-no reflex-bld, T-helper cells (CD4) count ([facility])  Smoker Will give him Wellbutrin to help with his smoking and also for mood stabilization. He is to start with 1 pill a day for 3 or 4 days then go to twice a day dosing. He will contact me in a month or so  to let me know how he is doing. He is to stay on his other medications. Strongly encouraged him to not let his insurance will run out and if he has difficulties, contact [facility] to see if they can help.

## 2016-04-20 LAB — T-HELPER CELLS (CD4) COUNT (NOT AT ARMC)
ABSOLUTE CD4: 400 {cells}/uL (ref 381–1469)
CD4 T Helper %: 22 % — ABNORMAL LOW (ref 32–62)
TOTAL LYMPHOCYTE COUNT: 1848 {cells}/uL (ref 700–3300)

## 2016-04-22 LAB — HIV-1 RNA QUANT-NO REFLEX-BLD: HIV 1 RNA Quant: <20 copies/mL (ref <20)

## 2016-04-26 ENCOUNTER — Other Ambulatory Visit: Payer: Self-pay

## 2016-05-03 ENCOUNTER — Other Ambulatory Visit: Payer: BLUE CROSS/BLUE SHIELD

## 2016-05-03 DIAGNOSIS — R748 Abnormal levels of other serum enzymes: Secondary | ICD-10-CM

## 2016-05-04 LAB — COMPREHENSIVE METABOLIC PANEL
ALT: 53 U/L — AB (ref 9–46)
AST: 74 U/L — AB (ref 10–35)
Albumin: 2.8 g/dL — ABNORMAL LOW (ref 3.6–5.1)
Alkaline Phosphatase: 195 U/L — ABNORMAL HIGH (ref 40–115)
BUN: 3 mg/dL — AB (ref 7–25)
CALCIUM: 8.2 mg/dL — AB (ref 8.6–10.3)
CO2: 19 mmol/L — AB (ref 20–31)
Chloride: 93 mmol/L — ABNORMAL LOW (ref 98–110)
Creat: 0.67 mg/dL — ABNORMAL LOW (ref 0.70–1.33)
GLUCOSE: 76 mg/dL (ref 65–99)
POTASSIUM: 4 mmol/L (ref 3.5–5.3)
Sodium: 123 mmol/L — ABNORMAL LOW (ref 135–146)
Total Bilirubin: 0.4 mg/dL (ref 0.2–1.2)
Total Protein: 5.7 g/dL — ABNORMAL LOW (ref 6.1–8.1)

## 2016-05-04 LAB — CBC WITH DIFFERENTIAL/PLATELET
BASOS PCT: 0 %
Basophils Absolute: 0 cells/uL (ref 0–200)
Eosinophils Absolute: 100 cells/uL (ref 15–500)
Eosinophils Relative: 2 %
HEMATOCRIT: 42.8 % (ref 38.5–50.0)
Hemoglobin: 14.3 g/dL (ref 13.2–17.1)
LYMPHS PCT: 33 %
Lymphs Abs: 1650 cells/uL (ref 850–3900)
MCH: 32.6 pg (ref 27.0–33.0)
MCHC: 33.4 g/dL (ref 32.0–36.0)
MCV: 97.5 fL (ref 80.0–100.0)
MONO ABS: 850 {cells}/uL (ref 200–950)
MONOS PCT: 17 %
MPV: 9.8 fL (ref 7.5–12.5)
NEUTROS PCT: 48 %
Neutro Abs: 2400 cells/uL (ref 1500–7800)
PLATELETS: 270 10*3/uL (ref 140–400)
RBC: 4.39 MIL/uL (ref 4.20–5.80)
RDW: 15.2 % — AB (ref 11.0–15.0)
WBC: 5 10*3/uL (ref 4.0–10.5)

## 2016-05-05 ENCOUNTER — Telehealth: Payer: Self-pay

## 2016-05-05 NOTE — Telephone Encounter (Signed)
Left word for word message on pt cell # 

## 2016-05-05 NOTE — Telephone Encounter (Signed)
Let him know that I think he needs to come in sooner than that but if he gets sick, he needs to go to the hospital.

## 2016-05-05 NOTE — Telephone Encounter (Signed)
I called pt this morning left messages on both machines Dr.Lalonde want pt to come in this morning to go over labs called again at 11:50 pt answered phone he said he could not come in today he was offered appointment with Kristian CoveyShane Tysinger 05/06/16 pt refused he made appointment with Dr.Lalonde on 05/10/16 at 1:30

## 2016-05-10 ENCOUNTER — Encounter: Payer: Self-pay | Admitting: Family Medicine

## 2016-05-10 ENCOUNTER — Ambulatory Visit (INDEPENDENT_AMBULATORY_CARE_PROVIDER_SITE_OTHER): Payer: BLUE CROSS/BLUE SHIELD | Admitting: Family Medicine

## 2016-05-10 VITALS — BP 100/60 | HR 80 | Wt 132.4 lb

## 2016-05-10 DIAGNOSIS — E871 Hypo-osmolality and hyponatremia: Secondary | ICD-10-CM

## 2016-05-10 DIAGNOSIS — R748 Abnormal levels of other serum enzymes: Secondary | ICD-10-CM | POA: Diagnosis not present

## 2016-05-10 DIAGNOSIS — Z79899 Other long term (current) drug therapy: Secondary | ICD-10-CM

## 2016-05-10 LAB — COMPREHENSIVE METABOLIC PANEL
ALT: 32 U/L (ref 9–46)
AST: 36 U/L — AB (ref 10–35)
Albumin: 2.9 g/dL — ABNORMAL LOW (ref 3.6–5.1)
Alkaline Phosphatase: 173 U/L — ABNORMAL HIGH (ref 40–115)
BILIRUBIN TOTAL: 0.4 mg/dL (ref 0.2–1.2)
BUN: 3 mg/dL — AB (ref 7–25)
CO2: 23 mmol/L (ref 20–31)
CREATININE: 0.66 mg/dL — AB (ref 0.70–1.33)
Calcium: 8.1 mg/dL — ABNORMAL LOW (ref 8.6–10.3)
Chloride: 103 mmol/L (ref 98–110)
GLUCOSE: 82 mg/dL (ref 65–99)
Potassium: 3.8 mmol/L (ref 3.5–5.3)
SODIUM: 129 mmol/L — AB (ref 135–146)
Total Protein: 5.4 g/dL — ABNORMAL LOW (ref 6.1–8.1)

## 2016-05-10 NOTE — Progress Notes (Signed)
   Subjective:    Patient ID: [name] [name], male    DOB: May 29, 1960, 55 y.o.   MRN: [id number]  HPI He is here for follow-up on recent blood work. He did show low sodium as well as a low BUS and, protein and elevated liver enzymes. He states that since he left the hospital he has not been interested in eating. He states that he is drinking alcohol on average of 2 drinks per day. He states that over the last 3 or 4 days, his appetite has improved and he is eating 3 meals regularly. He otherwise has had no weakness, numbness, tingling, lassitude. His headaches are now gone since he stopped taking the Effexor and does think that the Wellbutrin is helping keep him more calm.   Review of Systems     Objective:   Physical Exam Alert and in no distress. Tympanic membranes and canals are normal. Pharyngeal area is normal. Neck is supple without adenopathy or thyromegaly. Cardiac exam shows a regular sinus rhythm without murmurs or gallops. Lungs are clear to auscultation. DTRs are normal.       Assessment & Plan:  Encounter for long-term (current) use of high-risk medication - Plan: Comprehensive metabolic panel  Hyponatremia - Plan: Comprehensive metabolic panel  Abnormal liver enzymes - Plan: Comprehensive metabolic panel I explained that he needs to avoid all alcohol and make sure that he is eating properly. I explained that if his sodium is still low, he will need to get this taken care of in the hospital.

## 2016-05-30 NOTE — Telephone Encounter (Signed)
dt ?

## 2016-06-07 ENCOUNTER — Telehealth: Payer: Self-pay | Admitting: Internal Medicine

## 2016-06-07 NOTE — Telephone Encounter (Signed)
Tamika from [facility] (mail order) called to let us know she has called pt multiple times to get in touch with him to find out about his insurance info for his meds to sent out. I gave her the phone numbers that were listed as she had one of them already. If she can not get in touch with him she will have to put meds back on hold until he calls them. His [employer] termed and needing new info.   [facility] mail order phone number [phone]

## 2017-10-17 IMAGING — DX DG RIBS W/ CHEST 3+V*L*
4 series · 4 of 4 positions shown · non-contrast
Comparison: Yesterday

CLINICAL DATA: Fall 3 weeks ago with pain.

EXAM:
LEFT RIBS AND CHEST - 3+ VIEW

[chest pa]
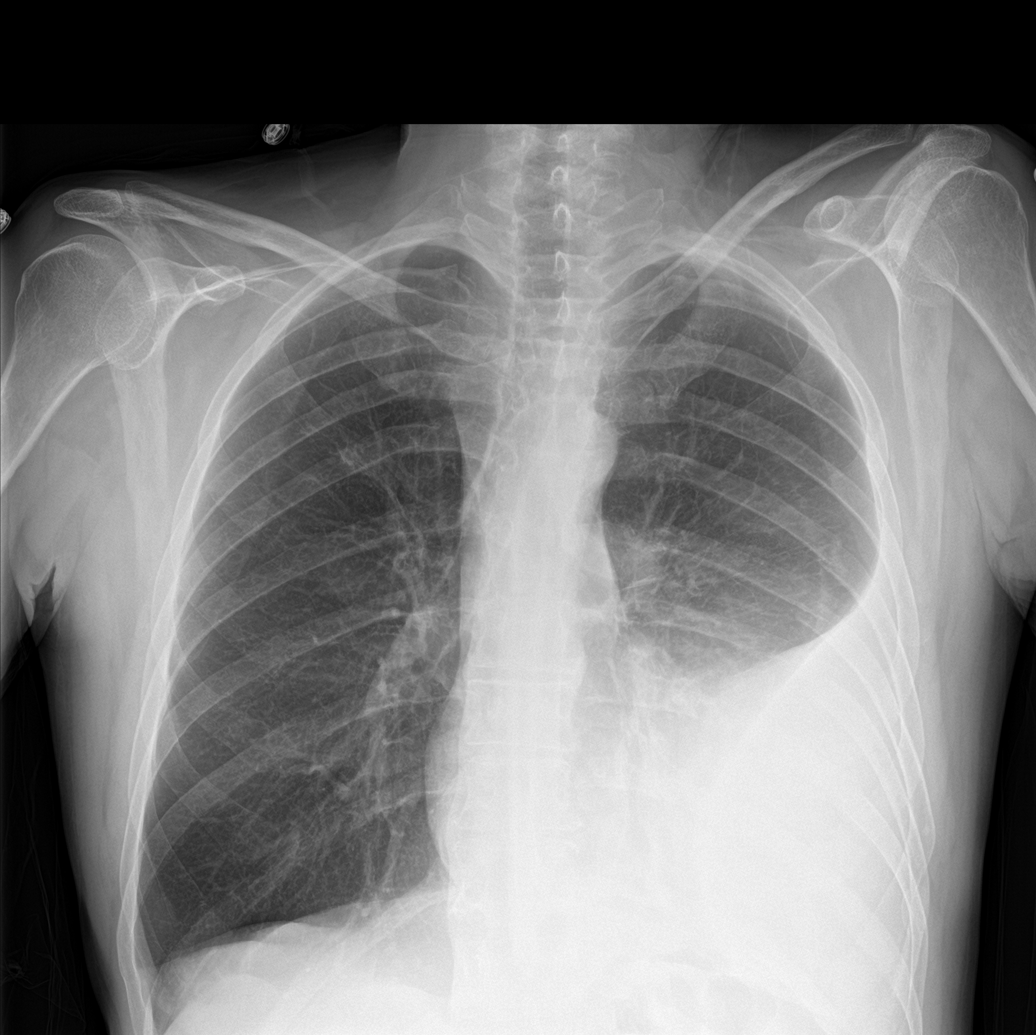

[rib pa obl]
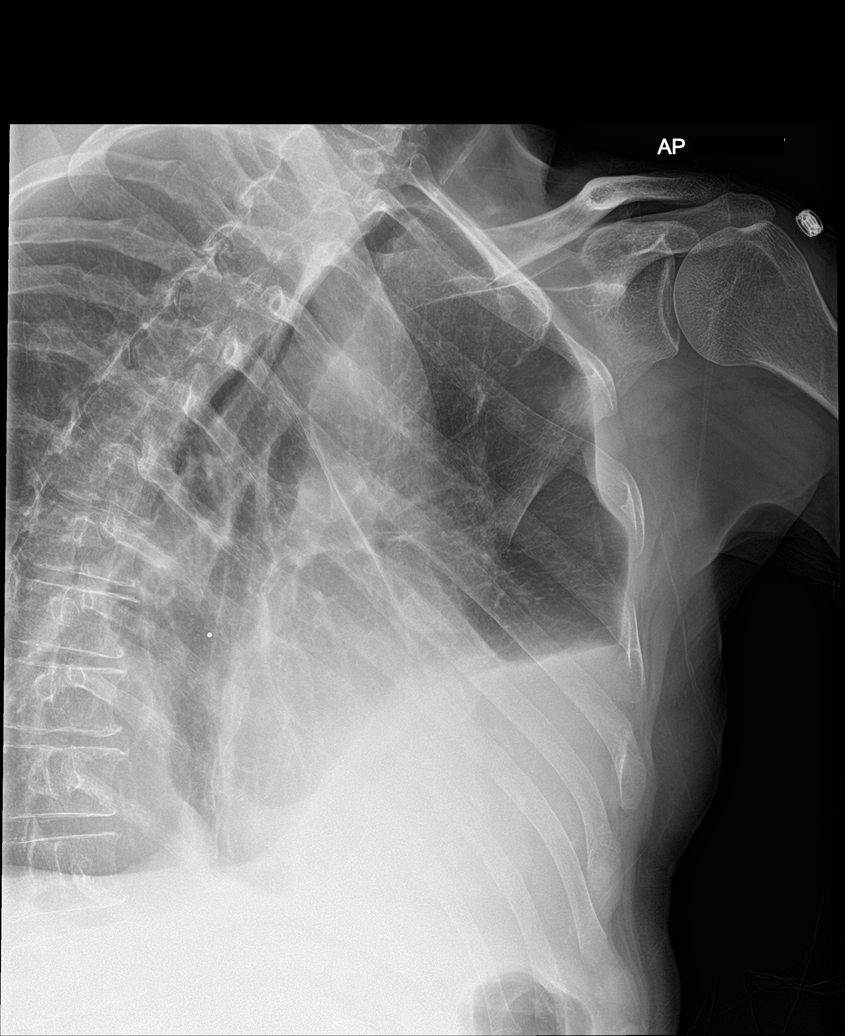

[rib pa (1 of 2)]
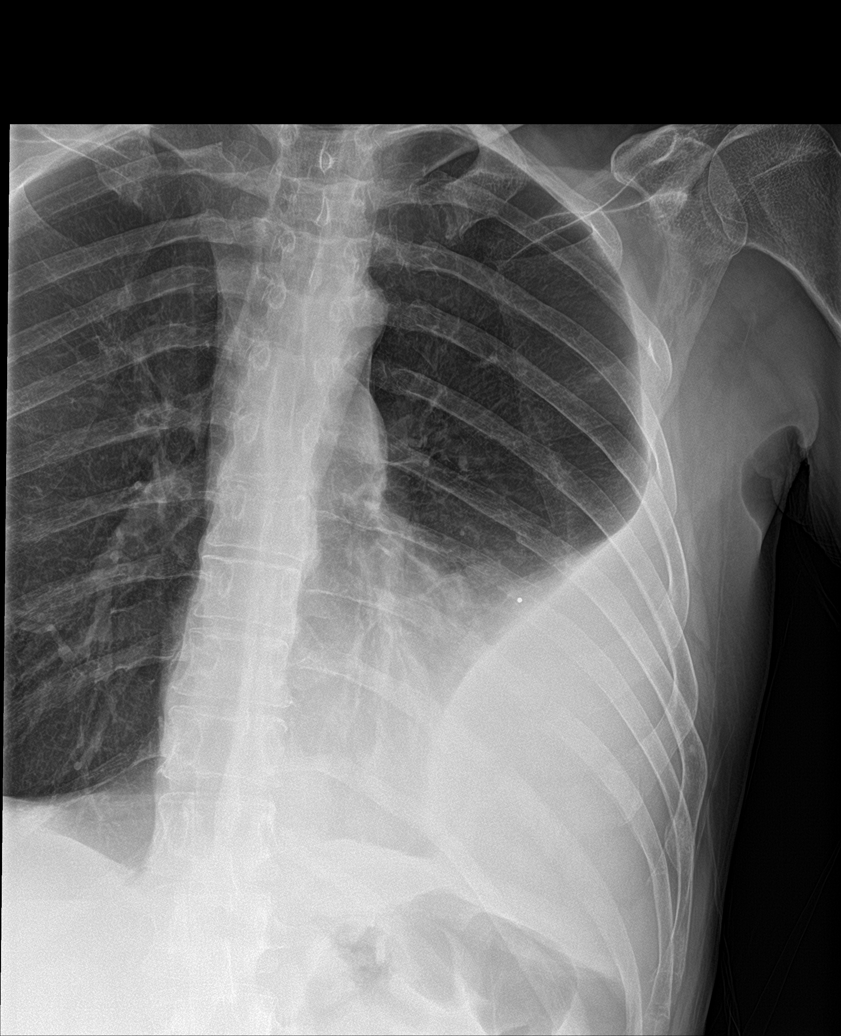

[rib pa (2 of 2)]
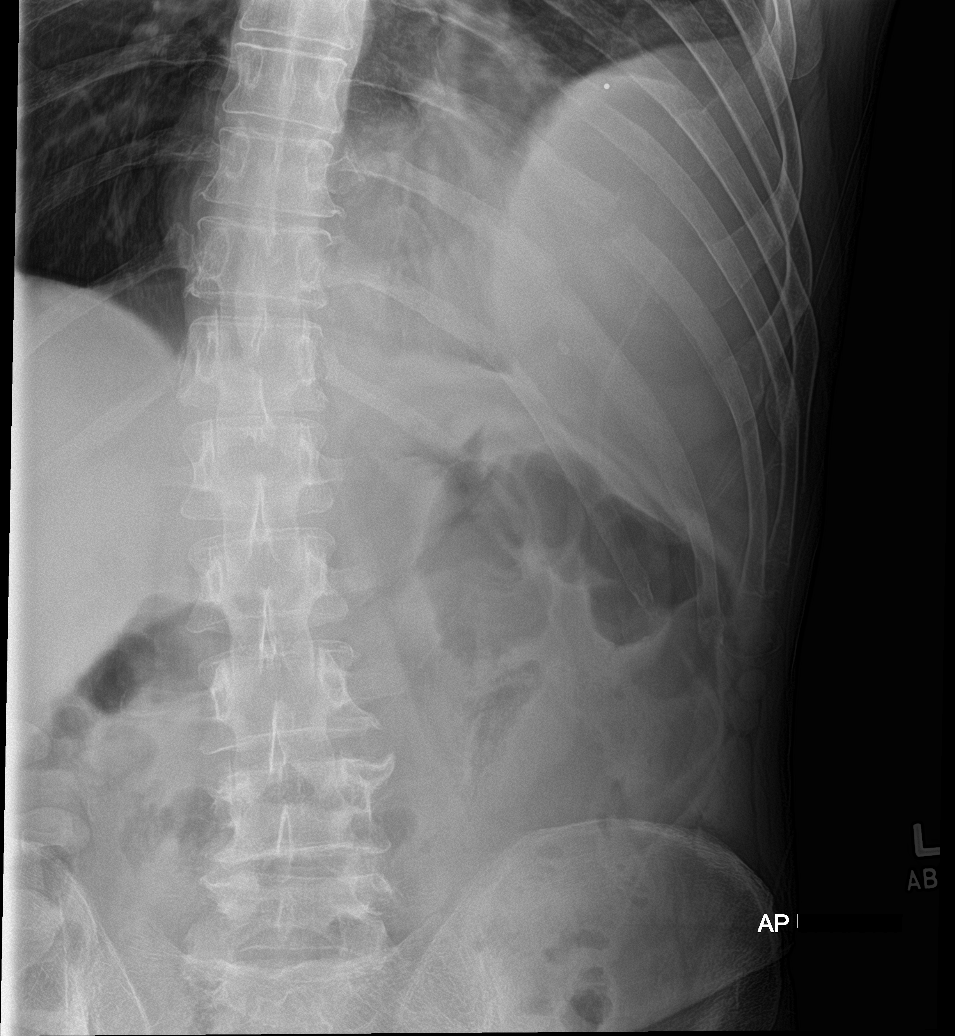

[4 of 4 positions shown; findings below may reference images not displayed]

FINDINGS: Moderate loculated pleural effusion at the lateral left base.
Pneumothorax is not confidently identified today. The underlying
lung is at least atelectatic. Suspected contusion on prior study is
not seen today and may have been pleural fluid. Hyperinflation of
the right lung. Normal heart size and mediastinal contours.
Posterior left seventh, eighth, ninth, and tenth rib fractures with
up to moderate displacement.
IMPRESSION: 1. Moderate loculated left pneumothorax, presumably hemothorax in
the setting of acute left seventh through tenth rib fractures. No
visible pneumothorax.
2. Right diaphragm flattening, possible COPD.

## 2017-10-19 IMAGING — CR DG CHEST 2V
3 series · 3 of 3 positions shown · non-contrast
Comparison: CT 02/28/2016.  Chest x-ray 02/27/2016.

CLINICAL DATA: Prior fall.  Injury.

EXAM:
CHEST  2 VIEW

[chest pa]
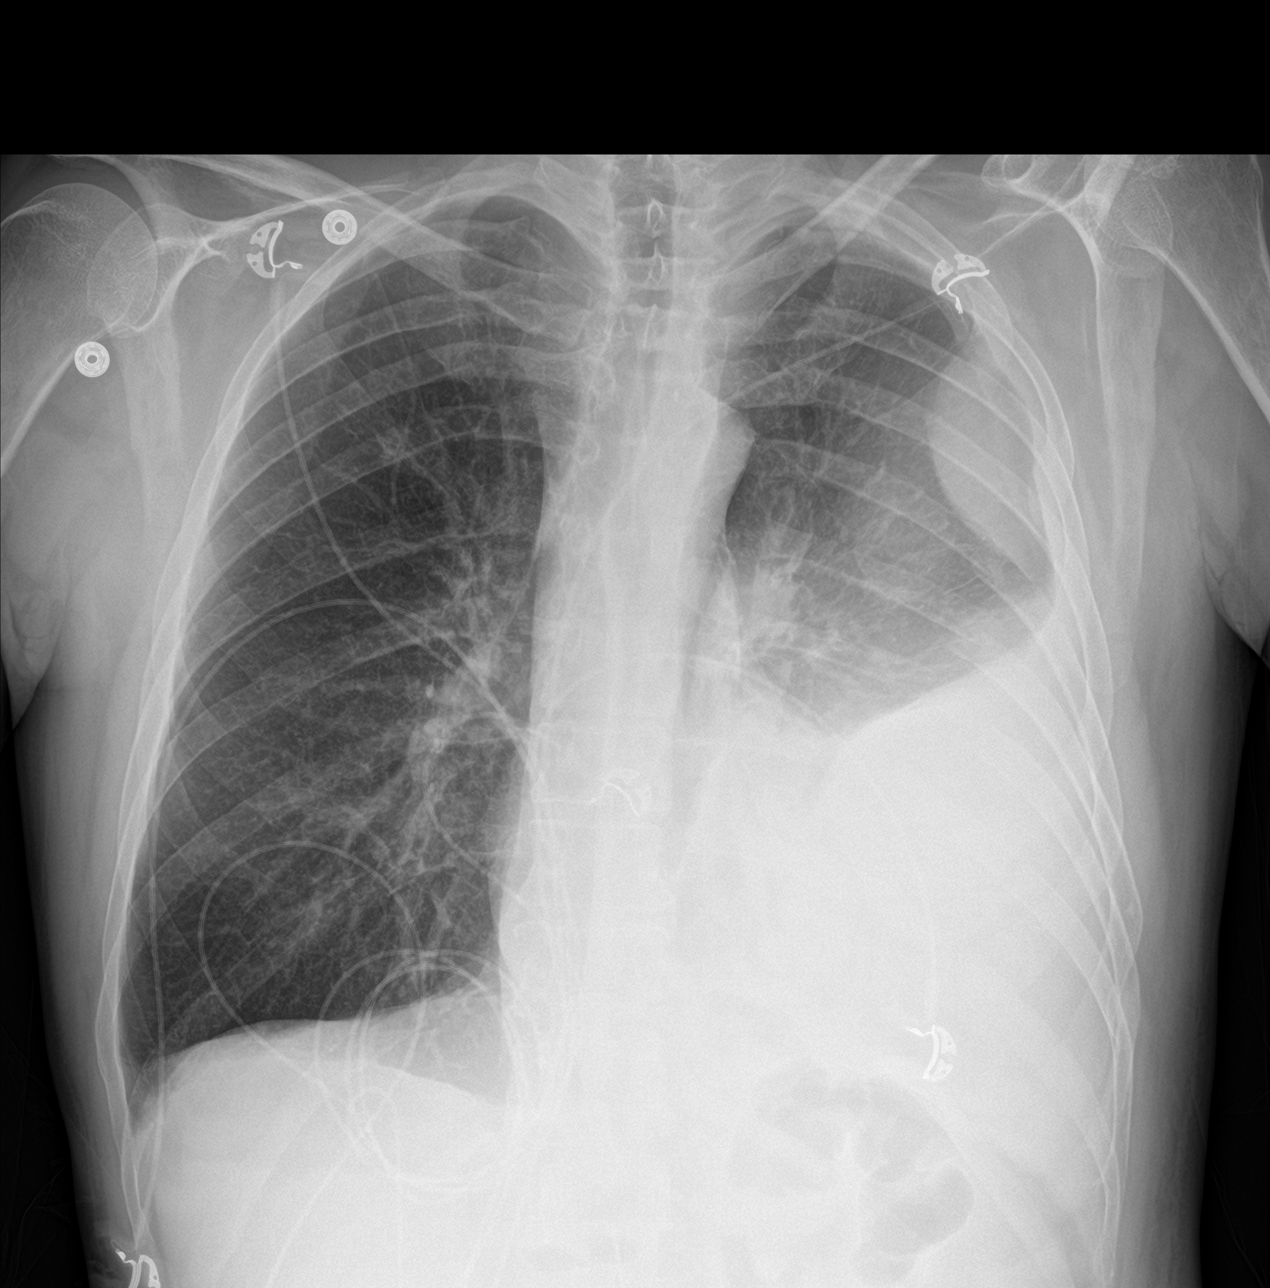

[chest lat (1 of 2)]
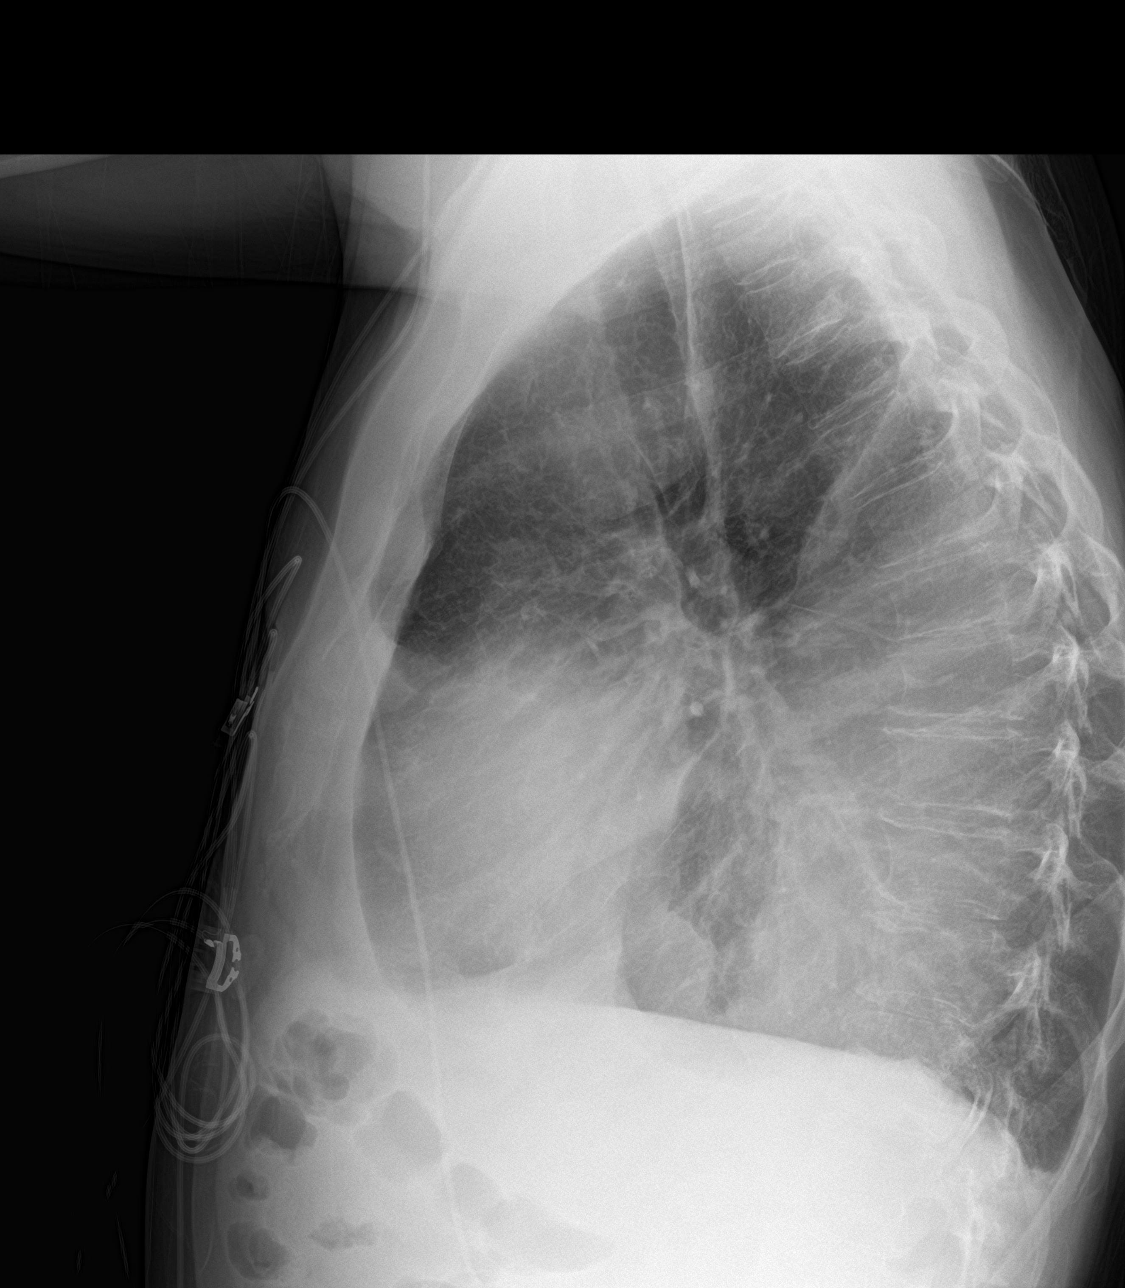

[chest lat (2 of 2)]
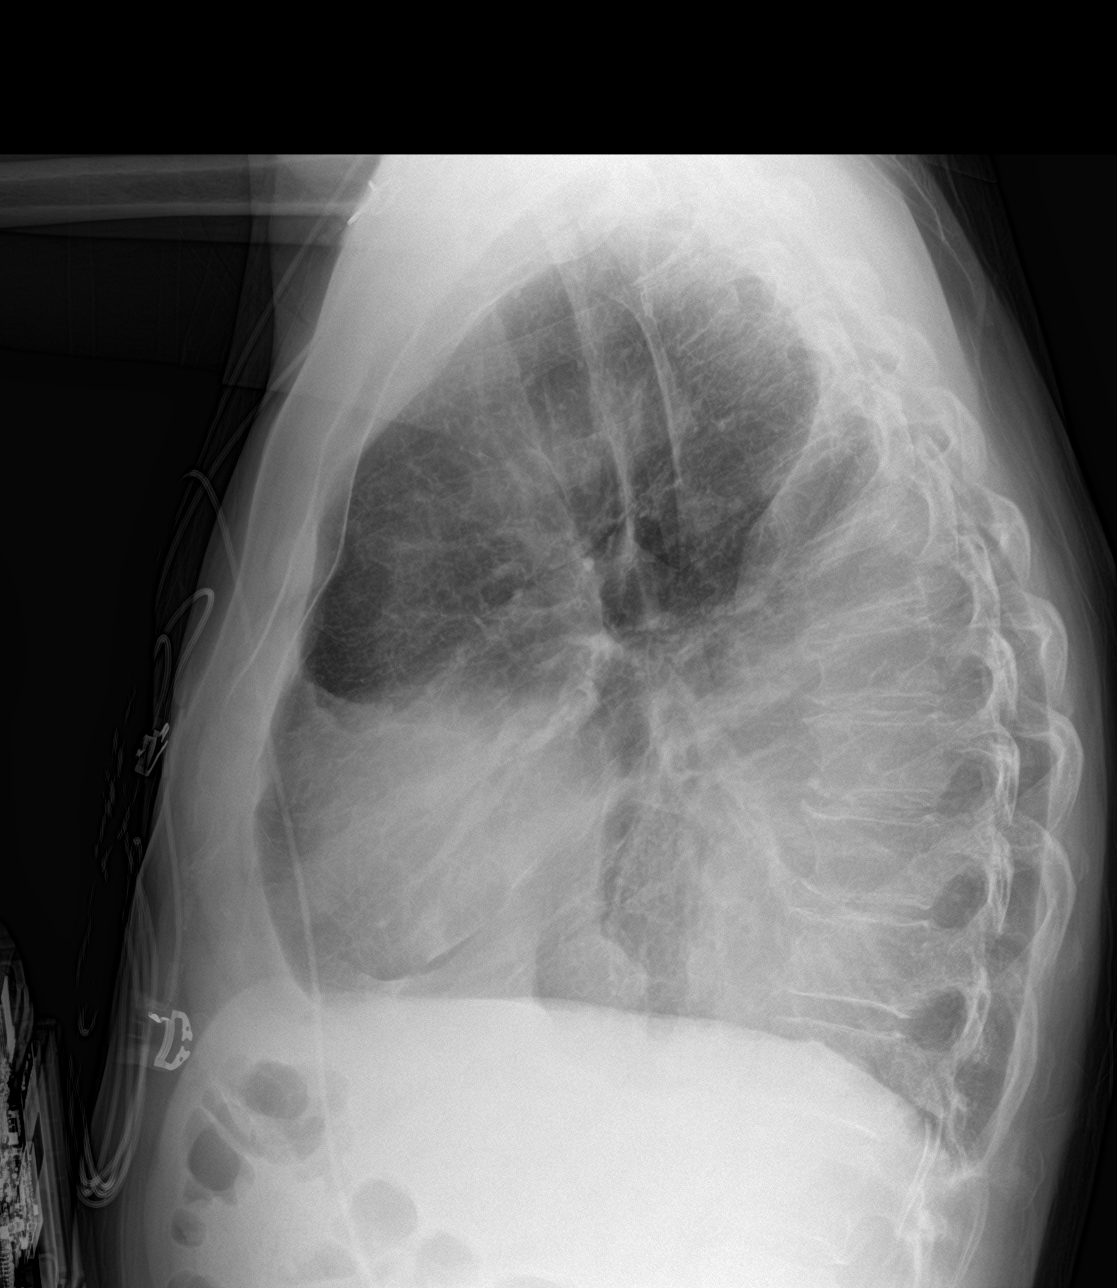

[3 of 3 positions shown; findings below may reference images not displayed]

FINDINGS: Mediastinum and hilar structures are unremarkable. Persistent
atelectatic changes in the left lower lobe. Previously identified
cavitary focus in the left lung not identified by plain film exam.
Reference is made to prior chest CT report 02/28/2016. Persistent
left-sided pleural effusion/ hemothorax again noted with slight
progression. Loculated component may be present laterally. No
pneumothorax. Multiple left rib fractures are again noted interim
change.
IMPRESSION: 1. Interim slight progression of left hemothorax. Loculated
component may be present laterally. Stable left rib fractures. No
pneumothorax.

2. Persistent atelectasis left lower lobe. Previously identified
tiny cavitary focus in the left lung not identified by plain film
exam. Reference is made to prior chest CT report 02/28/2016.

## 2017-10-20 IMAGING — CR DG CHEST 1V PORT
1 series · 1 of 1 positions shown · non-contrast
Comparison: 02/29/2016

CLINICAL DATA: Followup left-sided video-assisted thoracoscopy.

EXAM:
PORTABLE CHEST 1 VIEW

[AP]
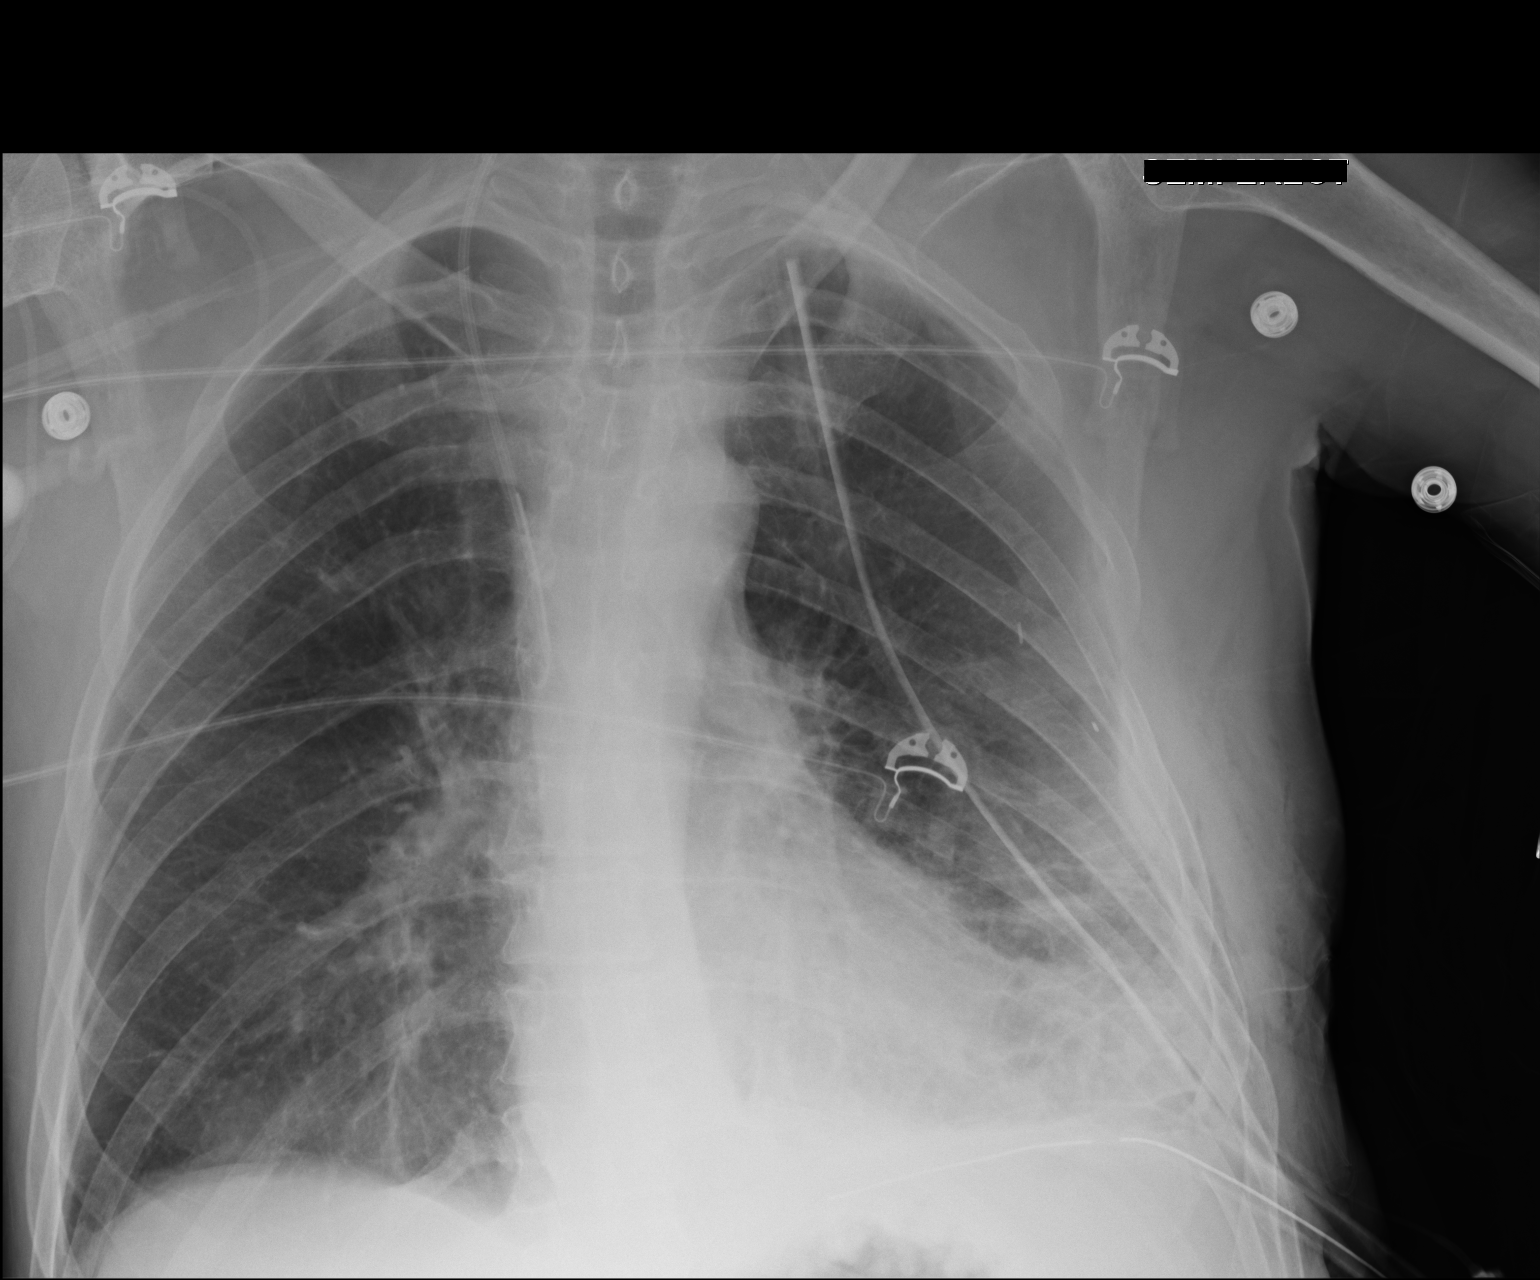

[1 of 1 positions shown; findings below may reference images not displayed]

FINDINGS: Right internal jugular central line has its tip in the SVC at the
azygos level. Two left chest tubes are in place. There is much less
pleural density on the left. There is some volume loss in the left
lower lobe. Right lung is clear except for minimal basilar
atelectasis. Left rib fractures again noted.
IMPRESSION: Good appearance following thoracoscopy. Much less pleural density on
the left. Some atelectasis in the lower lungs left more than right.
No pneumothorax.

## 2018-09-14 DEATH — deceased
